# Patient Record
Sex: Female | Born: 1955 | Race: White | Hispanic: No | Marital: Married | Smoking: Former smoker
Health system: Southern US, Community
[De-identification: ages and names within clinical notes are randomized; demographics above are authoritative.]

## PROBLEM LIST (undated history)

## (undated) DIAGNOSIS — M8580 Other specified disorders of bone density and structure, unspecified site: Secondary | ICD-10-CM

## (undated) DIAGNOSIS — H269 Unspecified cataract: Secondary | ICD-10-CM

## (undated) DIAGNOSIS — E785 Hyperlipidemia, unspecified: Secondary | ICD-10-CM

## (undated) DIAGNOSIS — M810 Age-related osteoporosis without current pathological fracture: Secondary | ICD-10-CM

## (undated) HISTORY — PX: PELVIC LAPAROSCOPY: SHX162

## (undated) HISTORY — PX: OTHER SURGICAL HISTORY: SHX169

## (undated) HISTORY — DX: Age-related osteoporosis without current pathological fracture: M81.0

## (undated) HISTORY — PX: OOPHORECTOMY: SHX86

## (undated) HISTORY — PX: TUBAL LIGATION: SHX77

## (undated) HISTORY — DX: Unspecified cataract: H26.9

## (undated) HISTORY — DX: Other specified disorders of bone density and structure, unspecified site: M85.80

## (undated) HISTORY — PX: BREAST SURGERY: SHX581

## (undated) HISTORY — DX: Hyperlipidemia, unspecified: E78.5

## (undated) HISTORY — PX: JOINT REPLACEMENT: SHX530

---

## 1998-04-21 ENCOUNTER — Ambulatory Visit (HOSPITAL_BASED_OUTPATIENT_CLINIC_OR_DEPARTMENT_OTHER): Admission: RE | Admit: 1998-04-21 | Discharge: 1998-04-21 | Payer: Self-pay | Admitting: Surgery

## 1998-04-26 HISTORY — PX: BREAST EXCISIONAL BIOPSY: SUR124

## 1999-03-15 ENCOUNTER — Other Ambulatory Visit: Admission: RE | Admit: 1999-03-15 | Discharge: 1999-03-15 | Payer: Self-pay | Admitting: Obstetrics and Gynecology

## 2002-09-08 ENCOUNTER — Encounter: Admission: RE | Admit: 2002-09-08 | Discharge: 2002-09-08 | Payer: Self-pay | Admitting: Obstetrics and Gynecology

## 2002-09-08 ENCOUNTER — Encounter: Payer: Self-pay | Admitting: Obstetrics and Gynecology

## 2002-09-10 ENCOUNTER — Other Ambulatory Visit: Admission: RE | Admit: 2002-09-10 | Discharge: 2002-09-10 | Payer: Self-pay | Admitting: Obstetrics and Gynecology

## 2003-09-14 ENCOUNTER — Other Ambulatory Visit: Admission: RE | Admit: 2003-09-14 | Discharge: 2003-09-14 | Payer: Self-pay | Admitting: Obstetrics and Gynecology

## 2004-08-04 ENCOUNTER — Encounter: Admission: RE | Admit: 2004-08-04 | Discharge: 2004-08-04 | Payer: Self-pay | Admitting: Obstetrics and Gynecology

## 2004-09-20 ENCOUNTER — Other Ambulatory Visit: Admission: RE | Admit: 2004-09-20 | Discharge: 2004-09-20 | Payer: Self-pay | Admitting: Obstetrics and Gynecology

## 2005-09-04 ENCOUNTER — Ambulatory Visit: Payer: Self-pay | Admitting: Internal Medicine

## 2005-09-22 ENCOUNTER — Ambulatory Visit: Payer: Self-pay | Admitting: Internal Medicine

## 2005-09-22 ENCOUNTER — Encounter (INDEPENDENT_AMBULATORY_CARE_PROVIDER_SITE_OTHER): Payer: Self-pay | Admitting: *Deleted

## 2005-10-18 ENCOUNTER — Encounter: Admission: RE | Admit: 2005-10-18 | Discharge: 2005-10-18 | Payer: Self-pay | Admitting: Obstetrics and Gynecology

## 2005-10-24 ENCOUNTER — Other Ambulatory Visit: Admission: RE | Admit: 2005-10-24 | Discharge: 2005-10-24 | Payer: Self-pay | Admitting: Obstetrics and Gynecology

## 2006-10-30 ENCOUNTER — Encounter: Admission: RE | Admit: 2006-10-30 | Discharge: 2006-10-30 | Payer: Self-pay | Admitting: Obstetrics and Gynecology

## 2006-11-01 ENCOUNTER — Other Ambulatory Visit: Admission: RE | Admit: 2006-11-01 | Discharge: 2006-11-01 | Payer: Self-pay | Admitting: Obstetrics and Gynecology

## 2007-11-04 ENCOUNTER — Encounter: Admission: RE | Admit: 2007-11-04 | Discharge: 2007-11-04 | Payer: Self-pay | Admitting: Obstetrics and Gynecology

## 2007-11-07 ENCOUNTER — Other Ambulatory Visit: Admission: RE | Admit: 2007-11-07 | Discharge: 2007-11-07 | Payer: Self-pay | Admitting: Obstetrics and Gynecology

## 2008-02-12 ENCOUNTER — Ambulatory Visit (HOSPITAL_BASED_OUTPATIENT_CLINIC_OR_DEPARTMENT_OTHER): Admission: RE | Admit: 2008-02-12 | Discharge: 2008-02-12 | Payer: Self-pay | Admitting: Obstetrics and Gynecology

## 2008-02-12 ENCOUNTER — Encounter: Payer: Self-pay | Admitting: Obstetrics and Gynecology

## 2008-11-04 ENCOUNTER — Encounter: Admission: RE | Admit: 2008-11-04 | Discharge: 2008-11-04 | Payer: Self-pay | Admitting: Obstetrics and Gynecology

## 2008-11-09 ENCOUNTER — Other Ambulatory Visit: Admission: RE | Admit: 2008-11-09 | Discharge: 2008-11-09 | Payer: Self-pay | Admitting: Obstetrics and Gynecology

## 2008-11-09 ENCOUNTER — Ambulatory Visit: Payer: Self-pay | Admitting: Obstetrics and Gynecology

## 2009-02-09 ENCOUNTER — Ambulatory Visit: Payer: Self-pay | Admitting: Obstetrics and Gynecology

## 2009-08-18 ENCOUNTER — Ambulatory Visit: Payer: Self-pay | Admitting: Gynecology

## 2009-08-19 ENCOUNTER — Ambulatory Visit: Payer: Self-pay | Admitting: Gynecology

## 2009-08-25 ENCOUNTER — Encounter: Payer: Self-pay | Admitting: Obstetrics and Gynecology

## 2009-08-25 ENCOUNTER — Ambulatory Visit: Payer: Self-pay | Admitting: Obstetrics and Gynecology

## 2009-09-01 ENCOUNTER — Ambulatory Visit: Payer: Self-pay | Admitting: Obstetrics and Gynecology

## 2009-11-05 ENCOUNTER — Encounter: Admission: RE | Admit: 2009-11-05 | Discharge: 2009-11-05 | Payer: Self-pay | Admitting: Obstetrics and Gynecology

## 2009-11-10 ENCOUNTER — Ambulatory Visit: Payer: Self-pay | Admitting: Obstetrics and Gynecology

## 2009-11-10 ENCOUNTER — Other Ambulatory Visit: Admission: RE | Admit: 2009-11-10 | Discharge: 2009-11-10 | Payer: Self-pay | Admitting: Obstetrics and Gynecology

## 2009-11-20 HISTORY — PX: COLONOSCOPY: SHX174

## 2010-01-04 ENCOUNTER — Ambulatory Visit: Payer: Self-pay | Admitting: Obstetrics and Gynecology

## 2010-08-01 ENCOUNTER — Encounter (INDEPENDENT_AMBULATORY_CARE_PROVIDER_SITE_OTHER): Payer: Self-pay | Admitting: *Deleted

## 2010-09-01 ENCOUNTER — Encounter (INDEPENDENT_AMBULATORY_CARE_PROVIDER_SITE_OTHER): Payer: Self-pay

## 2010-09-05 ENCOUNTER — Ambulatory Visit: Payer: Self-pay | Admitting: Internal Medicine

## 2010-09-27 ENCOUNTER — Ambulatory Visit: Payer: Self-pay | Admitting: Internal Medicine

## 2010-11-29 ENCOUNTER — Encounter
Admission: RE | Admit: 2010-11-29 | Discharge: 2010-11-29 | Payer: Self-pay | Source: Home / Self Care | Attending: Obstetrics and Gynecology | Admitting: Obstetrics and Gynecology

## 2010-12-01 ENCOUNTER — Other Ambulatory Visit
Admission: RE | Admit: 2010-12-01 | Discharge: 2010-12-01 | Payer: Self-pay | Source: Home / Self Care | Admitting: Obstetrics and Gynecology

## 2010-12-01 ENCOUNTER — Ambulatory Visit
Admission: RE | Admit: 2010-12-01 | Discharge: 2010-12-01 | Payer: Self-pay | Source: Home / Self Care | Attending: Obstetrics and Gynecology | Admitting: Obstetrics and Gynecology

## 2010-12-20 NOTE — Procedures (Signed)
Summary: Colonoscopy  Patient: [name] Note: All result statuses are Final unless otherwise noted.  Tests: (1) Colonoscopy (COL)   COL Colonoscopy           DONE     [facility]     [address]     [place], [place]  [address]           COLONOSCOPY PROCEDURE REPORT           PATIENT:  Carolyn Moss, Carolyn Moss  MR#:  [id number]     BIRTHDATE:  [birth year], 54 yrs. old  GENDER:  female     ENDOSCOPIST:  Hedwig Morton. Juanda Chance, MD     REF. BY:  Edyth Gunnels, M.D.     PROCEDURE DATE:  09/27/2010     PROCEDURE:  Colonoscopy 19147     ASA CLASS:  Class I     INDICATIONS:  history of pre-cancerous (adenomatous) colon polyps     aden. polyp in 2006     MEDICATIONS:   Versed 10 mg, Fentanyl 100 mcg           DESCRIPTION OF PROCEDURE:   After the risks benefits and     alternatives of the procedure were thoroughly explained, informed     consent was obtained.  Digital rectal exam was performed and     revealed no rectal masses.   The LB PCF-H180AL [id number] endoscope     was introduced through the anus and advanced to the cecum, which     was identified by both the appendix and ileocecal valve, without     limitations.  The quality of the prep was good, using MiraLax.     The instrument was then slowly withdrawn as the colon was fully     examined.     <<PROCEDUREIMAGES>>           FINDINGS:  Mild diverticulosis was found in the sigmoid colon (see     image1 and image5).  This was otherwise a normal examination of     the colon (see image2, image3, image4, and image6).   Retroflexed     views in the rectum revealed no abnormalities.    The scope was     then withdrawn from the patient and the procedure completed.           COMPLICATIONS:  None     ENDOSCOPIC IMPRESSION:     1) Mild diverticulosis in the sigmoid colon     2) Otherwise normal examination     RECOMMENDATIONS:     1) high fiber diet     REPEAT EXAM:  In 10 year(s) for.           ______________________________  Hedwig Morton. Juanda Chance, MD           CC:           n.     eSIGNED:   Hedwig Morton. Brodie at 09/27/2010 08:55 AM           [place], [place], [phone]  Note: An exclamation mark (!) indicates a result that was not dispersed into the flowsheet. Document Creation Date: 09/27/2010 8:56 AM _______________________________________________________________________  (1) Order result status: Final Collection or observation date-time: 09/27/2010 08:50 Requested date-time:  Receipt date-time:  Reported date-time:  Referring Physician:   Ordering Physician: Lina Sar [id number]) Specimen Source:  Source: Launa Grill Order Number: [id number] Lab site:   Appended Document: Colonoscopy    Clinical Lists Changes  Observations: Added new observation of  COLONNXTDUE: 09/2020 (09/27/2010 13:12)

## 2010-12-20 NOTE — Letter (Signed)
Summary: [facility] Instructions  [facility]  [address] [place], [place] [address]   Phone: [phone]  Fax: [phone]       Carolyn Moss    [birth year]    MRN: [id number]       Procedure Day Dorna Bloom:  Jake Shark  09/27/10     Arrival Time:  7:30AM     Procedure Time:  8:30AM     Location of Procedure:                    _ X_  [facility] (4th Floor)   PREPARATION FOR COLONOSCOPY WITH MIRALAX  Starting 5 days prior to your procedure 09/22/10 do not eat nuts, seeds, popcorn, corn, beans, peas,  salads, or any raw vegetables.  Do not take any fiber supplements (e.g. Metamucil, Citrucel, and Benefiber). ____________________________________________________________________________________________________   THE DAY BEFORE YOUR PROCEDURE         DATE: 09/26/10  DAY: MONDAY  1   Drink clear liquids the entire day-NO SOLID FOOD  2   Do not drink anything colored red or purple.  Avoid juices with pulp.  No orange juice.  3   Drink at least 64 oz. (8 glasses) of fluid/clear liquids during the day to prevent dehydration and help the prep work efficiently.  CLEAR LIQUIDS INCLUDE: Water Jello Ice Popsicles Tea (sugar ok, no milk/cream) Powdered fruit flavored drinks Coffee (sugar ok, no milk/cream) Gatorade Juice: apple, white grape, white cranberry  Lemonade Clear bullion, consomm, broth Carbonated beverages (any kind) Strained chicken noodle soup Hard Candy  4   Mix the entire bottle of Miralax with 64 oz. of Gatorade/Powerade in the morning and put in the refrigerator to chill.  5   At 3:00 pm take 2 Dulcolax/Bisacodyl tablets.  6   At 4:30 pm take one Reglan/Metoclopramide tablet.  7  Starting at 5:00 pm drink one 8 oz glass of the Miralax mixture every 15-20 minutes until you have finished drinking the entire 64 oz.  You should finish drinking prep around 7:30 or 8:00 pm.  8   If you are nauseated, you may take the 2nd Reglan/Metoclopramide  tablet at 6:30 pm.        9    At 8:00 pm take 2 more DULCOLAX/Bisacodyl tablets.     THE DAY OF YOUR PROCEDURE      DATE:  09/27/10  DAY: Jake Shark  You may drink clear liquids until 6:30AM  (2 HOURS BEFORE PROCEDURE).   MEDICATION INSTRUCTIONS   none      OTHER INSTRUCTIONS  You will need a responsible adult at least 57 years of age to accompany you and drive you home.   This person must remain in the waiting room during your procedure.  Wear loose fitting clothing that is easily removed.  Leave jewelry and other valuables at home.  However, you may wish to bring a book to read or an iPod/MP3 player to listen to music as you wait for your procedure to start.  Remove all body piercing jewelry and leave at home.  Total time from sign-in until discharge is approximately 2-3 hours.  You should go home directly after your procedure and rest.  You can resume normal activities the day after your procedure.  The day of your procedure you should not:   Drive   Make legal decisions   Operate machinery   Drink alcohol   Return to work  You will receive specific instructions about eating,  activities and medications before you leave.   The above instructions have been reviewed and explained to me by  Carolyn Church RN II  September 05, 2010 11:14 AM      I fully understand and can verbalize these instructions _____________________________ Date _______

## 2010-12-20 NOTE — Letter (Signed)
Summary: Pre Visit Letter Revised  [facility]  [address] [place], [place] [address]   Phone: [phone]  Fax: [phone]        08/01/2010 MRN: [id number] [facility] [address] [place], [place]  [address]             Procedure Date:  09/21/2010   Welcome to the Gastroenterology Division at [facility].    You are scheduled to see a nurse for your pre-procedure visit on 09/05/2010 at 11:00AM on the 3rd floor at [facility], [address]  We ask that you try to arrive at our office 15 minutes prior to your appointment time to allow for check-in.  Please take a minute to review the attached form.  If you answer "Yes" to one or more of the questions on the first page, we ask that you call the person listed at your earliest opportunity.  If you answer "No" to all of the questions, please complete the rest of the form and bring it to your appointment.    Your nurse visit will consist of discussing your medical and surgical history, your immediate family medical history, and your medications.   If you are unable to list all of your medications on the form, please bring the medication bottles to your appointment and we will list them.  We will need to be aware of both prescribed and over the counter drugs.  We will need to know exact dosage information as well.    Please be prepared to read and sign documents such as consent forms, a financial agreement, and acknowledgement forms.  If necessary, and with your consent, a friend or relative is welcome to sit-in on the nurse visit with you.  Please bring your insurance card so that we may make a copy of it.  If your insurance requires a referral to see a specialist, please bring your referral form from your primary care physician.  No co-pay is required for this nurse visit.     If you cannot keep your appointment, please call [phone] to cancel or reschedule prior to your appointment date.  This  allows [place] the opportunity to schedule an appointment for another patient in need of care.    Thank you for choosing [facility] for your medical needs.  We appreciate the opportunity to care for you.  Please visit [place] at our website  to learn more about our practice.  Sincerely, The Gastroenterology Division

## 2010-12-20 NOTE — Miscellaneous (Signed)
Summary: [facility] PV prep  Clinical Lists Changes  Medications: Added new medication of MIRALAX   POWD (POLYETHYLENE GLYCOL 3350) As per prep  instructions. - Signed Added new medication of DULCOLAX 5 MG  TBEC (BISACODYL) Day before procedure take 2 at 3pm and 2 at 8pm. - Signed Added new medication of METOCLOPRAMIDE HCL 10 MG  TABS (METOCLOPRAMIDE HCL) As per prep instructions. - Signed Rx of MIRALAX   POWD (POLYETHYLENE GLYCOL 3350) As per prep  instructions.;  #255gm x 0;  Signed;  Entered by: Doristine Church RN II;  Authorized by: Hart Carwin MD;  Method used: Electronically to [facility]. #[id number]*, [address], [place], [place], [place]  [address], Ph: [phone], Fax: [phone] Rx of DULCOLAX 5 MG  TBEC (BISACODYL) Day before procedure take 2 at 3pm and 2 at 8pm.;  #4 x 0;  Signed;  Entered by: Doristine Church RN II;  Authorized by: Hart Carwin MD;  Method used: Electronically to [facility]. #[id number]*, [address] [place], [place], [place]  [address], Ph: [phone], Fax: [phone] Rx of METOCLOPRAMIDE HCL 10 MG  TABS (METOCLOPRAMIDE HCL) As per prep instructions.;  #2 x 0;  Signed;  Entered by: Doristine Church RN II;  Authorized by: Hart Carwin MD;  Method used: Electronically to [facility]. #[id number]*, [address] [place], [place], [place]  [address], Ph: [phone], Fax: [phone] Observations: Added new observation of ALLERGY REV: Done (09/05/2010 10:46) Added new observation of NKA: T (09/05/2010 10:46)    Prescriptions: METOCLOPRAMIDE HCL 10 MG  TABS (METOCLOPRAMIDE HCL) As per prep instructions.  #2 x 0   Entered by:   Doristine Church RN II   Authorized by:   Hart Carwin MD   Signed by:   Doristine Church RN II on 09/05/2010   Method used:   Electronically to        [facility]. [phone]* (retail)       [address]       [place], [place]  [address]  Ph: [phone]       Fax: [phone]   RxID:   [id number] DULCOLAX 5 MG  TBEC (BISACODYL) Day before procedure take 2 at 3pm and 2 at 8pm.  #4 x 0   Entered by:   Doristine Church RN II   Authorized by:   Hart Carwin MD   Signed by:   Doristine Church RN II on 09/05/2010   Method used:   Electronically to        [facility]. [phone]* (retail)       [address]       [place], [place]  [address]       Ph: [phone]       Fax: [phone]   RxID:   [id number] MIRALAX   POWD (POLYETHYLENE GLYCOL 3350) As per prep  instructions.  #255gm x 0   Entered by:   Doristine Church RN II   Authorized by:   Hart Carwin MD   Signed by:   Doristine Church RN II on 09/05/2010   Method used:   Electronically to        [facility]. [phone]* (retail)       [address]       [facility]  [place], [place]  [address]       Ph: [phone]       Fax: [phone]   RxID:   [id number]

## 2011-04-04 NOTE — Op Note (Signed)
NAME:  Carolyn Moss, Carolyn Moss                ACCOUNT NO.:  [id number]   MEDICAL RECORD NO.:  [id number]          PATIENT TYPE:  AMB   LOCATION:  [facility]                         FACILITY:  [facility]   PHYSICIAN:  Daniel L. Gottsegen, M.D.DATE OF BIRTH:  07-16-1956   DATE OF PROCEDURE:  02/12/2008  DATE OF DISCHARGE:                               OPERATIVE REPORT   PREOPERATIVE DIAGNOSIS:  Right adnexal mass, probable endometrioma.   POSTOPERATIVE DIAGNOSIS:  Right endometrioma.   OPERATION:  Diagnostic laparoscopy with right salpingo-oophorectomy.   SURGEON:  Daniel L. Eda Paschal, M.D.   FIRST ASSISTANT:  Timothy P. Fontaine, M.D.   INDICATIONS:  The patient is a 55 year old gravida 3, para 3, AB zero  who had presented to the office for her annual exam.  Findings consisted  of a new adnexal mass on the right ovary.  Ultrasound revealed a 4 x 6  cm enlargement of the right ovary.  It had the appearance of an  endometrioma with a ground glass appearance.  CA-125, however, was  elevated at 85.  It was elected initially just to watch it with hopes  that it was a hemorrhagic cyst with an elevated CA-125 and it would  disappear.  However, serial ultrasound showed that it had not changed in  size at all and possibly had even grown a little bit.  As a result of  this she was taken to the operating room for definitive surgery.   FINDINGS:  At the time of surgery the patient's uterus was enlarged by 2  several small fibroids.  Left ovary and tube were normal.  Pelvic  peritoneum and vesicouterine fold of peritoneum were free of disease.  Right ovary was significantly enlarged to about 6 cm.  There was a  surface implant of endometriosis on it.  Once the ovary had been removed  there was chocolate material draining from the ovary consistent with an  endometrioma.   PROCEDURE IN DETAIL:  After adequate general endotracheal anesthesia the  patient was placed in the dorsal lithotomy position, prepped and  draped  in the usual sterile manner.  A Hulka catheter was inserted in to the  patient's uterus.  A Foley catheter was inserted in the patient's  bladder.  A subumbilical midline incision was made and through that the  laparoscope was introduced directly using an OptiView without any trauma  to the peritoneal cavity or its structures.  Two 5 mm ports were placed  in the right and left lower quadrant.  The pelvis was visualized and was  as noted above.  Peritoneal washings were obtained.  The right adnexa  was then elevated and the ureter was identified.  The IP ligament was  isolated far away from the ureter and using the LigaSure  laparoscopically the IP ligament was bipolared and cut.  The rest of the  structures involving the adnexa and broad ligament were cauterized and  then cut and then the attachment of the ovary and tube to the uterus was  cauterized and cut.  There was a little bit of bleeding at the area of  the  IP ligament and this was controlled again with cautery.  The ureter  continued to be far from the area of concern.  Urine output continued to  be copious and clear.  A 5 mm scope was then introduced and an Endopouch  was placed subumbilically.  The specimen was removed.  When it was  mostly through the fascia it could not be completely removed without  making the incision extended significantly so at this point a needle was  used with a syringe and chocolate material was drained from the cyst to  collapse it without any of it entering the patient.  This was done  without difficulty and then the ovary and tube were removed and sent to  pathology for tissue diagnosis.  The 10 mm scope was reintroduced.  Copious irrigation was done.  There was absolutely no bleeding noted.  Therefore all instrumentation was removed.  The pneumoperitoneum was  evacuated.  The subumbilical fascial incision was closed with two figure-  of-eights of zero Vicryl.  The subumbilical skin incision  was closed  with 3-0 Monocryl.  The two lower incisions were closed with Dermabond.  The patient tolerated the procedure well and left the operating room in  satisfactory condition draining clear urine from her Foley catheter  which was then removed.      Daniel L. Eda Paschal, M.D.  Electronically Signed     DLG/MEDQ  D:  02/12/2008  T:  02/12/2008  Job:  [id number]

## 2011-11-27 ENCOUNTER — Other Ambulatory Visit: Payer: Self-pay | Admitting: Obstetrics and Gynecology

## 2011-11-27 DIAGNOSIS — Z1231 Encounter for screening mammogram for malignant neoplasm of breast: Secondary | ICD-10-CM

## 2011-12-07 ENCOUNTER — Encounter: Payer: Self-pay | Admitting: Gynecology

## 2011-12-07 DIAGNOSIS — N809 Endometriosis, unspecified: Secondary | ICD-10-CM | POA: Insufficient documentation

## 2011-12-14 ENCOUNTER — Ambulatory Visit
Admission: RE | Admit: 2011-12-14 | Discharge: 2011-12-14 | Disposition: A | Discharge status: Home / Self Care | Payer: BC Managed Care – PPO | Source: Ambulatory Visit | Attending: Obstetrics and Gynecology | Admitting: Obstetrics and Gynecology

## 2011-12-14 ENCOUNTER — Ambulatory Visit (INDEPENDENT_AMBULATORY_CARE_PROVIDER_SITE_OTHER): Payer: BC Managed Care – PPO | Admitting: Obstetrics and Gynecology

## 2011-12-14 ENCOUNTER — Other Ambulatory Visit: Payer: BC Managed Care – PPO

## 2011-12-14 ENCOUNTER — Encounter: Payer: Self-pay | Admitting: Obstetrics and Gynecology

## 2011-12-14 ENCOUNTER — Other Ambulatory Visit (HOSPITAL_COMMUNITY)
Admission: RE | Admit: 2011-12-14 | Discharge: 2011-12-14 | Disposition: A | Discharge status: Home / Self Care | Payer: BC Managed Care – PPO | Source: Ambulatory Visit | Attending: Obstetrics and Gynecology | Admitting: Obstetrics and Gynecology

## 2011-12-14 ENCOUNTER — Other Ambulatory Visit: Payer: Self-pay | Admitting: *Deleted

## 2011-12-14 VITALS — BP 124/76 | Ht 62.0 in | Wt 159.0 lb

## 2011-12-14 DIAGNOSIS — Z01419 Encounter for gynecological examination (general) (routine) without abnormal findings: Secondary | ICD-10-CM

## 2011-12-14 DIAGNOSIS — Z13220 Encounter for screening for lipoid disorders: Secondary | ICD-10-CM

## 2011-12-14 DIAGNOSIS — E559 Vitamin D deficiency, unspecified: Secondary | ICD-10-CM

## 2011-12-14 DIAGNOSIS — Z1231 Encounter for screening mammogram for malignant neoplasm of breast: Secondary | ICD-10-CM

## 2011-12-14 LAB — LIPID PANEL
Cholesterol: 241 mg/dL — ABNORMAL HIGH (ref 0–200)
HDL: 61 mg/dL (ref >39)
Triglycerides: 105 mg/dL (ref <150)

## 2011-12-14 LAB — URINALYSIS W MICROSCOPIC + REFLEX CULTURE
Glucose, UA: NEGATIVE mg/dL
Hgb urine dipstick: NEGATIVE
Leukocytes, UA: NEGATIVE
Nitrite: NEGATIVE
pH: 7 (ref 5.0–8.0)

## 2011-12-14 LAB — CBC WITH DIFFERENTIAL/PLATELET
Basophils Absolute: 0 10*3/uL (ref 0.0–0.1)
Basophils Relative: 1 % (ref 0–1)
HCT: 41.3 % (ref 36.0–46.0)
Hemoglobin: 13.7 g/dL (ref 12.0–15.0)
Lymphocytes Relative: 27 % (ref 12–46)
MCHC: 33.2 g/dL (ref 30.0–36.0)
Monocytes Absolute: 0.6 10*3/uL (ref 0.1–1.0)
Neutro Abs: 3.5 10*3/uL (ref 1.7–7.7)
Neutrophils Relative %: 56 % (ref 43–77)
RDW: 13.4 % (ref 11.5–15.5)
WBC: 6.2 10*3/uL (ref 4.0–10.5)

## 2011-12-14 NOTE — Patient Instructions (Signed)
Scheduled bone density in February or March.

## 2011-12-14 NOTE — Progress Notes (Signed)
Patient came to see me today for her annual GYN exam. We also did lab work on her this morning. She had a mammogram this afternoon. Her last bone density was in 2011 and she had osteopenia without  an elevated FRAX risk. She takes calcium and vitamin D. She has had no fractures. She's had no more postmenopausal bleeding. She is having no pelvic pain. She does well without HRT.  Physical examination: Kennon Portela present. HEENT within normal limits. Neck: Thyroid not large. No masses. Supraclavicular nodes: not enlarged. Breasts: Examined in both sitting midline position. No skin changes and no masses. Abdomen: Soft no guarding rebound or masses or hernia. Pelvic: External: Within normal limits. BUS: Within normal limits. Vaginal:within normal limits. Good estrogen effect. No evidence of cystocele rectocele or enterocele. Cervix: clean. Uterus: Normal size and shape. Adnexa: No masses. Rectovaginal exam: Confirmatory and negative. Extremities: Within normal limits.  Assessment: Low bone mass  Plan: Continue yearly mammograms. Continue periodic bone densities.

## 2011-12-15 ENCOUNTER — Other Ambulatory Visit: Payer: Self-pay | Admitting: *Deleted

## 2011-12-15 DIAGNOSIS — E7800 Pure hypercholesterolemia, unspecified: Secondary | ICD-10-CM

## 2011-12-15 LAB — VITAMIN D 25 HYDROXY (VIT D DEFICIENCY, FRACTURES): Vit D, 25-Hydroxy: 33 ng/mL (ref 30–89)

## 2011-12-22 DIAGNOSIS — M8580 Other specified disorders of bone density and structure, unspecified site: Secondary | ICD-10-CM

## 2011-12-22 HISTORY — DX: Other specified disorders of bone density and structure, unspecified site: M85.80

## 2012-01-09 ENCOUNTER — Other Ambulatory Visit: Payer: Self-pay | Admitting: Obstetrics and Gynecology

## 2012-01-09 ENCOUNTER — Ambulatory Visit (INDEPENDENT_AMBULATORY_CARE_PROVIDER_SITE_OTHER): Payer: BC Managed Care – PPO

## 2012-01-09 DIAGNOSIS — M8580 Other specified disorders of bone density and structure, unspecified site: Secondary | ICD-10-CM

## 2012-01-09 DIAGNOSIS — M899 Disorder of bone, unspecified: Secondary | ICD-10-CM

## 2012-01-09 DIAGNOSIS — M949 Disorder of cartilage, unspecified: Secondary | ICD-10-CM

## 2012-12-06 ENCOUNTER — Other Ambulatory Visit: Payer: Self-pay | Admitting: Gynecology

## 2012-12-06 DIAGNOSIS — Z1231 Encounter for screening mammogram for malignant neoplasm of breast: Secondary | ICD-10-CM

## 2012-12-17 ENCOUNTER — Encounter: Payer: BC Managed Care – PPO | Admitting: Gynecology

## 2012-12-31 ENCOUNTER — Ambulatory Visit
Admission: RE | Admit: 2012-12-31 | Discharge: 2012-12-31 | Disposition: A | Discharge status: Home / Self Care | Payer: BC Managed Care – PPO | Source: Ambulatory Visit | Attending: Gynecology | Admitting: Gynecology

## 2012-12-31 DIAGNOSIS — Z1231 Encounter for screening mammogram for malignant neoplasm of breast: Secondary | ICD-10-CM

## 2013-01-01 ENCOUNTER — Encounter: Payer: Self-pay | Admitting: Gynecology

## 2013-01-01 ENCOUNTER — Ambulatory Visit (INDEPENDENT_AMBULATORY_CARE_PROVIDER_SITE_OTHER): Payer: BC Managed Care – PPO | Admitting: Gynecology

## 2013-01-01 VITALS — BP 122/78 | Ht 63.0 in | Wt 158.0 lb

## 2013-01-01 DIAGNOSIS — Z01419 Encounter for gynecological examination (general) (routine) without abnormal findings: Secondary | ICD-10-CM

## 2013-01-01 DIAGNOSIS — Z13220 Encounter for screening for lipoid disorders: Secondary | ICD-10-CM

## 2013-01-01 DIAGNOSIS — M899 Disorder of bone, unspecified: Secondary | ICD-10-CM

## 2013-01-01 DIAGNOSIS — M8580 Other specified disorders of bone density and structure, unspecified site: Secondary | ICD-10-CM

## 2013-01-01 LAB — CBC WITH DIFFERENTIAL/PLATELET
Basophils Absolute: 0 10*3/uL (ref 0.0–0.1)
Basophils Relative: 1 % (ref 0–1)
Eosinophils Absolute: 0.4 10*3/uL (ref 0.0–0.7)
Eosinophils Relative: 5 % (ref 0–5)
HCT: 41.5 % (ref 36.0–46.0)
Hemoglobin: 14.3 g/dL (ref 12.0–15.0)
MCH: 30.6 pg (ref 26.0–34.0)
MCHC: 34.5 g/dL (ref 30.0–36.0)
MCV: 88.7 fL (ref 78.0–100.0)
Monocytes Absolute: 0.6 10*3/uL (ref 0.1–1.0)
Monocytes Relative: 8 % (ref 3–12)
RDW: 14.1 % (ref 11.5–15.5)

## 2013-01-01 LAB — COMPREHENSIVE METABOLIC PANEL
Albumin: 4.8 g/dL (ref 3.5–5.2)
Alkaline Phosphatase: 50 U/L (ref 39–117)
BUN: 10 mg/dL (ref 6–23)
Creat: 0.88 mg/dL (ref 0.50–1.10)
Glucose, Bld: 86 mg/dL (ref 70–99)
Potassium: 3.8 mEq/L (ref 3.5–5.3)
Total Bilirubin: 0.5 mg/dL (ref 0.3–1.2)

## 2013-01-01 LAB — LIPID PANEL
HDL: 57 mg/dL (ref >39)
LDL Cholesterol: 162 mg/dL — ABNORMAL HIGH (ref 0–99)
Total CHOL/HDL Ratio: 4.5 Ratio
Triglycerides: 201 mg/dL — ABNORMAL HIGH (ref <150)

## 2013-01-01 NOTE — Progress Notes (Signed)
Carolyn Moss [birth year] [id number]        56 y.o.  G3P3003 for annual exam.  Former patient of Dr. Eda Paschal is doing well.  Past medical history,surgical history, medications, allergies, family history and social history were all reviewed and documented in the EPIC chart. ROS:  Was performed and pertinent positives and negatives are included in the history.  Exam: [name] assistant Filed Vitals:   01/01/13 1117  BP: 122/78  Height: 5\' 3"  (1.6 m)  Weight: 158 lb (71.668 kg)   General appearance  Normal Skin grossly normal Head/Neck normal with no cervical or supraclavicular adenopathy thyroid normal Lungs  clear Cardiac RR, without RMG Abdominal  soft, nontender, without masses, organomegaly or hernia Breasts  examined lying and sitting without masses, retractions, discharge or axillary adenopathy. Pelvic  Ext/BUS/vagina  normal with mild atrophic changes  Cervix  normal   Uterus  anteverted, normal size, shape and contour, midline and mobile nontender   Adnexa  Without masses or tenderness    Anus and perineum  normal   Rectovaginal  normal sphincter tone without palpated masses or tenderness.    Assessment/Plan:  57 y.o. G73P3003 female for annual exam.   1. Postmenopausal. Doing well without significant symptoms. We'll continue to monitor. Patient knows to report any bleeding at all. 2. Osteopenia. DEXA 12/2011 with T score -1.1 FRAX 13%/0.04%. Plan repeat in another year or 2. Increase calcium vitamin D reviewed. 3. Very strong history of breast cancer.   Mother developed at age 2. Patient's sister developed at age 74. Maternal grandmother at age 16. I asked whether genetic testing was done and she does not think so. I reviewed my strong recommendation that she consider genetic testing. Her sister is alive and well and I recommended she be tested. If she refuses then the patient can be tested. I also offered referral for genetic counseling to better define her risk. I reviewed what  she would do with the results to include if gene-positive considering prophylactic mastectomy and certainly salpingo-oophorectomy. Increased risk of ovarian cancer with positive gene discussed. Also discussed altering screening protocols to add MRI if gene positive.  Patient's unwilling to proceed at this point, will think about her options and follow up if she desires to pursue counseling or direct testing. She is going to talk to her sister to see if she was tested and then go from there.  Just had mammogram yesterday. Continue with annual mammography. SBE monthly reviewed. 4. Pap smear 2013. No Pap smear done today. No history of significant abnormal Pap smears.  I reviewed current screening guidelines and will plan repeat Pap smear at 3 year interval. 5. Colonoscopy 2011. We'll follow up with their recommended interval. 6. Health maintenance. Has not had blood work drawn for some time.  Baseline CBC comprehensive metabolic panel TSH urinalysis vitamin D lipid profile ordered.    Dara Lords MD, 12:58 PM 01/01/2013

## 2013-01-01 NOTE — Patient Instructions (Addendum)
Consider testing or have your sister tested for the breast cancer gene. Follow up for lab results through My chart Follow up in one year for annual exam.

## 2013-01-03 LAB — URINALYSIS W MICROSCOPIC + REFLEX CULTURE
Bilirubin Urine: NEGATIVE
Hgb urine dipstick: NEGATIVE
Ketones, ur: NEGATIVE mg/dL
Specific Gravity, Urine: 1.01 (ref 1.005–1.030)
Urobilinogen, UA: 0.2 mg/dL (ref 0.0–1.0)

## 2013-01-07 ENCOUNTER — Other Ambulatory Visit: Payer: Self-pay | Admitting: Gynecology

## 2013-01-07 DIAGNOSIS — E785 Hyperlipidemia, unspecified: Secondary | ICD-10-CM

## 2014-01-12 ENCOUNTER — Other Ambulatory Visit: Payer: Self-pay

## 2014-01-12 DIAGNOSIS — Z1231 Encounter for screening mammogram for malignant neoplasm of breast: Secondary | ICD-10-CM

## 2014-01-29 ENCOUNTER — Ambulatory Visit
Admission: RE | Admit: 2014-01-29 | Discharge: 2014-01-29 | Disposition: A | Discharge status: Home / Self Care | Payer: BC Managed Care – PPO | Source: Ambulatory Visit

## 2014-01-29 DIAGNOSIS — Z1231 Encounter for screening mammogram for malignant neoplasm of breast: Secondary | ICD-10-CM

## 2014-02-17 ENCOUNTER — Ambulatory Visit (INDEPENDENT_AMBULATORY_CARE_PROVIDER_SITE_OTHER): Payer: BC Managed Care – PPO | Admitting: Gynecology

## 2014-02-17 ENCOUNTER — Encounter: Payer: Self-pay | Admitting: Gynecology

## 2014-02-17 VITALS — BP 122/78 | Ht 61.0 in | Wt 161.0 lb

## 2014-02-17 DIAGNOSIS — M8580 Other specified disorders of bone density and structure, unspecified site: Secondary | ICD-10-CM | POA: Insufficient documentation

## 2014-02-17 DIAGNOSIS — M949 Disorder of cartilage, unspecified: Secondary | ICD-10-CM

## 2014-02-17 DIAGNOSIS — N951 Menopausal and female climacteric states: Secondary | ICD-10-CM

## 2014-02-17 DIAGNOSIS — Z8639 Personal history of other endocrine, nutritional and metabolic disease: Secondary | ICD-10-CM | POA: Insufficient documentation

## 2014-02-17 DIAGNOSIS — Z01419 Encounter for gynecological examination (general) (routine) without abnormal findings: Secondary | ICD-10-CM

## 2014-02-17 DIAGNOSIS — Z780 Asymptomatic menopausal state: Secondary | ICD-10-CM | POA: Insufficient documentation

## 2014-02-17 DIAGNOSIS — Z1159 Encounter for screening for other viral diseases: Secondary | ICD-10-CM

## 2014-02-17 DIAGNOSIS — M899 Disorder of bone, unspecified: Secondary | ICD-10-CM

## 2014-02-17 NOTE — Progress Notes (Signed)
Carolyn Moss [birth year] [id number]   History:    58 y.o.  for annual gyn exam with no complaints today. Review of patient's record as indicated that patient has a strong family history of breast cancer as follows:  Mother developed at age 80. Patient's sister developed at age 3. Maternal grandmother at age 47. Patient has previously been offered genetic testing but patient has been adamant. She is not certain if her sister who had breast cancer was tested for BRCA1 and BRCA2 gene mutation. Mammogram this year normal. Patient does her monthly breast exams.  Patient had a colonoscopy in 2011 but she has informed me that she has had benign polyps in the past. Her Tdap vaccine is up to date but she has not received the shingles vaccine yet. She gets her blood drawn here she has no other PCP. Patient with no prior history of abnormal Pap smears.  Patient had laparoscopic right salpingo-oophorectomy in the past for endometriosis. Patient with no visual motor symptoms and has not been on any HRT in the past.  Bone density study done in 2013 demonstrated evidence of osteopenia with T score -1.1 FRAX 13%/0.04%.    Past medical history,surgical history, family history and social history were all reviewed and documented in the EPIC chart.  Gynecologic History No LMP recorded. Patient is postmenopausal. Contraception: post menopausal status Last Pap: 2013. Results were: normal Last mammogram: 2015. Results were: normal  Obstetric History OB History  Gravida Para Term Preterm AB SAB TAB Ectopic Multiple Living  $Remov'3 3 3       3    'ypkuLl$ # Outcome Date GA Lbr Len/2nd Weight Sex Delivery Anes PTL Lv  3 TRM           2 TRM           1 TRM                ROS: A ROS was performed and pertinent positives and negatives are included in the history.  GENERAL: No fevers or chills. HEENT: No change in vision, no earache, sore throat or sinus congestion. NECK: No pain or stiffness. CARDIOVASCULAR: No chest  pain or pressure. No palpitations. PULMONARY: No shortness of breath, cough or wheeze. GASTROINTESTINAL: No abdominal pain, nausea, vomiting or diarrhea, melena or bright red blood per rectum. GENITOURINARY: No urinary frequency, urgency, hesitancy or dysuria. MUSCULOSKELETAL: No joint or muscle pain, no back pain, no recent trauma. DERMATOLOGIC: No rash, no itching, no lesions. ENDOCRINE: No polyuria, polydipsia, no heat or cold intolerance. No recent change in weight. HEMATOLOGICAL: No anemia or easy bruising or bleeding. NEUROLOGIC: No headache, seizures, numbness, tingling or weakness. PSYCHIATRIC: No depression, no loss of interest in normal activity or change in sleep pattern.     Exam: chaperone present  BP 122/78  Ht $R'5\' 1"'su$  (1.549 m)  Wt 161 lb (73.029 kg)  BMI 30.44 kg/m2  Body mass index is 30.44 kg/(m^2).  General appearance : Well developed well nourished female. No acute distress HEENT: Neck supple, trachea midline, no carotid bruits, no thyroidmegaly Lungs: Clear to auscultation, no rhonchi or wheezes, or rib retractions  Heart: Regular rate and rhythm, no murmurs or gallops Breast:Examined in sitting and supine position were symmetrical in appearance, no palpable masses or tenderness,  no skin retraction, no nipple inversion, no nipple discharge, no skin discoloration, no axillary or supraclavicular lymphadenopathy Abdomen: no palpable masses or tenderness, no rebound or guarding Extremities: no edema or skin discoloration or tenderness  Pelvic:  Bartholin, Urethra, Skene Glands: Within normal limits             Vagina: No gross lesions or discharge  Cervix: No gross lesions or discharge  Uterus  anteverted, normal size, shape and consistency, non-tender and mobile  Adnexa  Without masses or tenderness  Anus and perineum  normal   Rectovaginal  normal sphincter tone without palpated masses or tenderness             Hemoccult cards provided     Assessment/Plan:  58 y.o.  female for annual exam will return to the office later this week in the fasting state for the following lab work: Fasting lipid profile, comprehensive metabolic panel, TSH, vitamin D level, and urinalysis.  New CDC guidelines is recommending patients be tested once in her lifetime for hepatitis C antibody who were born between [birth year] through 1967. This was discussed with the patient today and has agreed to be tested today.  Patient will also schedule for her bone density study. Pap smear not done today in accordance to the new guidelines. She was once again counseled on the importance of being tested for BRCA1 and BRCA2 gene mutation as well as consultation with the geneticist and she states that she will continue to think about it.  We discussed importance of calcium and vitamin D and regular exercise for osteoporosis prevention. She was given a prescription to have her shingles vaccine. She was reminded to submit to the office the Fecal Hemoccult cards for testing.  Note: This dictation was prepared with  Dragon/digital dictation along withSmart phrase technology. Any transcriptional errors that result from this process are unintentional.   Terrance Mass MD, 11:11 AM 02/17/2014

## 2014-02-17 NOTE — Patient Instructions (Signed)
Shingles Vaccine  What You Need to Know  WHAT IS SHINGLES?  · Shingles is a painful skin rash, often with blisters. It is also called Herpes Zoster or just Zoster.  · A shingles rash usually appears on one side of the face or body and lasts from 2 to 4 weeks. Its main symptom is pain, which can be quite severe. Other symptoms of shingles can include fever, headache, chills, and upset stomach. Very rarely, a shingles infection can lead to pneumonia, hearing problems, blindness, brain inflammation (encephalitis), or death.  · For about 1 person in 5, severe pain can continue even after the rash clears up. This is called post-herpetic neuralgia.  · Shingles is caused by the Varicella Zoster virus. This is the same virus that causes chickenpox. Only someone who has had a case of chickenpox or rarely, has gotten chickenpox vaccine, can get shingles. The virus stays in your body. It can reappear many years later to cause a case of shingles.  · You cannot catch shingles from another person with shingles. However, a person who has never had chickenpox (or chickenpox vaccine) could get chickenpox from someone with shingles. This is not very common.  · Shingles is far more common in people 50 and older than in younger people. It is also more common in people whose immune systems are weakened because of a disease such as cancer or drugs such as steroids or chemotherapy.  · At least 1 million people get shingles per year in the [place].  SHINGLES VACCINE  · A vaccine for shingles was licensed in 2006. In clinical trials, the vaccine reduced the risk of shingles by 50%. It can also reduce the pain in people who still get shingles after being vaccinated.  · A single dose of shingles vaccine is recommended for adults 60 years of age and older.  SOME PEOPLE SHOULD NOT GET SHINGLES VACCINE OR SHOULD WAIT  A person should not get shingles vaccine if he or she:  · Has ever had a life-threatening allergic reaction to gelatin, the  antibiotic neomycin, or any other component of shingles vaccine. Tell your caregiver if you have any severe allergies.  · Has a weakened immune system because of current:  · AIDS or another disease that affects the immune system.  · Treatment with drugs that affect the immune system, such as prolonged use of high-dose steroids.  · Cancer treatment, such as radiation or chemotherapy.  · Cancer affecting the bone marrow or lymphatic system, such as leukemia or lymphoma.  · Is pregnant, or might be pregnant. Women should not become pregnant until at least 4 weeks after getting shingles vaccine.  Someone with a minor illness, such as a cold, may be vaccinated. Anyone with a moderate or severe acute illness should usually wait until he or she recovers before getting the vaccine. This includes anyone with a temperature of 101.3° F (38° C) or higher.  WHAT ARE THE RISKS FROM SHINGLES VACCINE?  · A vaccine, like any medicine, could possibly cause serious problems, such as severe allergic reactions. However, the risk of a vaccine causing serious harm, or death, is extremely small.  · No serious problems have been identified with shingles vaccine.  Mild Problems  · Redness, soreness, swelling, or itching at the site of the injection (about 1 person in 3).  · Headache (about 1 person in 70).  Like all vaccines, shingles vaccine is being closely monitored for unusual or severe problems.  WHAT IF   THERE IS A MODERATE OR SEVERE REACTION?  What should I look for?  Any unusual condition, such as a severe allergic reaction or a high fever. If a severe allergic reaction occurred, it would be within a few minutes to an hour after the shot. Signs of a serious allergic reaction can include difficulty breathing, weakness, hoarseness or wheezing, a fast heartbeat, hives, dizziness, paleness, or swelling of the throat.  What should I do?  · Call your caregiver, or get the person to a caregiver right away.  · Tell the caregiver what  happened, the date and time it happened, and when the vaccination was given.  · Ask the caregiver to report the reaction by filing a Vaccine Adverse Event Reporting System (VAERS) form. Or, you can file this report through the VAERS web site at www.vaers.hhs.gov or by calling 1-800-822-7967.  VAERS does not provide medical advice.  HOW CAN I LEARN MORE?  · Ask your caregiver. He or she can give you the vaccine package insert or suggest other sources of information.  · Contact the Centers for Disease Control and Prevention (CDC):  · Call 1-800-232-4636 (1-800-CDC-INFO).  · Visit the CDC website at www.cdc.gov/vaccines  CDC Shingles Vaccine VIS (08/25/08)  Document Released: 09/03/2006 Document Revised: 01/29/2012 Document Reviewed: 02/26/2013  [employer]® Patient Information ©2014 [employer].

## 2014-02-18 ENCOUNTER — Ambulatory Visit: Payer: BC Managed Care – PPO

## 2014-02-18 DIAGNOSIS — Z01419 Encounter for gynecological examination (general) (routine) without abnormal findings: Secondary | ICD-10-CM

## 2014-02-18 DIAGNOSIS — Z8639 Personal history of other endocrine, nutritional and metabolic disease: Secondary | ICD-10-CM

## 2014-02-18 DIAGNOSIS — Z1159 Encounter for screening for other viral diseases: Secondary | ICD-10-CM

## 2014-02-18 LAB — LIPID PANEL
CHOL/HDL RATIO: 4.5 ratio
CHOLESTEROL: 246 mg/dL — AB (ref 0–200)
HDL: 55 mg/dL (ref >39)
LDL Cholesterol: 162 mg/dL — ABNORMAL HIGH (ref 0–99)
Triglycerides: 143 mg/dL (ref <150)
VLDL: 29 mg/dL (ref 0–40)

## 2014-02-18 LAB — CBC WITH DIFFERENTIAL/PLATELET
BASOS ABS: 0.1 10*3/uL (ref 0.0–0.1)
BASOS PCT: 1 % (ref 0–1)
EOS ABS: 0.5 10*3/uL (ref 0.0–0.7)
Eosinophils Relative: 9 % — ABNORMAL HIGH (ref 0–5)
HCT: 39.2 % (ref 36.0–46.0)
HEMOGLOBIN: 13.7 g/dL (ref 12.0–15.0)
Lymphocytes Relative: 27 % (ref 12–46)
Lymphs Abs: 1.5 10*3/uL (ref 0.7–4.0)
MCH: 30.9 pg (ref 26.0–34.0)
MCHC: 34.9 g/dL (ref 30.0–36.0)
MCV: 88.5 fL (ref 78.0–100.0)
MONOS PCT: 9 % (ref 3–12)
Monocytes Absolute: 0.5 10*3/uL (ref 0.1–1.0)
NEUTROS ABS: 3.1 10*3/uL (ref 1.7–7.7)
NEUTROS PCT: 54 % (ref 43–77)
PLATELETS: 256 10*3/uL (ref 150–400)
RBC: 4.43 MIL/uL (ref 3.87–5.11)
RDW: 13.6 % (ref 11.5–15.5)
WBC: 5.7 10*3/uL (ref 4.0–10.5)

## 2014-02-18 LAB — COMPREHENSIVE METABOLIC PANEL
ALBUMIN: 4.3 g/dL (ref 3.5–5.2)
ALK PHOS: 54 U/L (ref 39–117)
ALT: 16 U/L (ref 0–35)
AST: 16 U/L (ref 0–37)
BILIRUBIN TOTAL: 0.4 mg/dL (ref 0.2–1.2)
BUN: 13 mg/dL (ref 6–23)
CO2: 22 mEq/L (ref 19–32)
Calcium: 9.2 mg/dL (ref 8.4–10.5)
Chloride: 107 mEq/L (ref 96–112)
Creat: 0.81 mg/dL (ref 0.50–1.10)
Glucose, Bld: 93 mg/dL (ref 70–99)
POTASSIUM: 4.6 meq/L (ref 3.5–5.3)
SODIUM: 140 meq/L (ref 135–145)
TOTAL PROTEIN: 6.2 g/dL (ref 6.0–8.3)

## 2014-02-18 LAB — TSH: TSH: 2.188 u[IU]/mL (ref 0.350–4.500)

## 2014-02-19 ENCOUNTER — Other Ambulatory Visit: Payer: Self-pay | Admitting: Gynecology

## 2014-02-19 DIAGNOSIS — E559 Vitamin D deficiency, unspecified: Secondary | ICD-10-CM

## 2014-02-19 LAB — URINALYSIS W MICROSCOPIC + REFLEX CULTURE
BACTERIA UA: NONE SEEN
BILIRUBIN URINE: NEGATIVE
CASTS: NONE SEEN
CRYSTALS: NONE SEEN
GLUCOSE, UA: NEGATIVE mg/dL
HGB URINE DIPSTICK: NEGATIVE
KETONES UR: NEGATIVE mg/dL
NITRITE: NEGATIVE
PH: 7.5 (ref 5.0–8.0)
Protein, ur: NEGATIVE mg/dL
SPECIFIC GRAVITY, URINE: 1.02 (ref 1.005–1.030)
Squamous Epithelial / LPF: NONE SEEN
Urobilinogen, UA: 0.2 mg/dL (ref 0.0–1.0)

## 2014-02-19 LAB — HEPATITIS C ANTIBODY: HCV AB: NEGATIVE

## 2014-02-19 LAB — VITAMIN D 25 HYDROXY (VIT D DEFICIENCY, FRACTURES): VIT D 25 HYDROXY: 25 ng/mL — AB (ref 30–89)

## 2014-02-19 MED ORDER — VITAMIN D (ERGOCALCIFEROL) 1.25 MG (50000 UNIT) PO CAPS: 50000.0000 [IU] | ORAL_CAPSULE | ORAL | Status: AC

## 2014-02-22 LAB — URINE CULTURE: Colony Count: 90000

## 2014-02-24 ENCOUNTER — Other Ambulatory Visit: Payer: Self-pay | Admitting: Gynecology

## 2014-02-24 MED ORDER — CIPROFLOXACIN HCL 250 MG PO TABS: 250.0000 mg | ORAL_TABLET | Freq: Two times a day (BID) | ORAL | Status: DC

## 2014-03-05 ENCOUNTER — Other Ambulatory Visit: Payer: Self-pay | Admitting: Gynecology

## 2014-03-05 ENCOUNTER — Ambulatory Visit (INDEPENDENT_AMBULATORY_CARE_PROVIDER_SITE_OTHER): Payer: BC Managed Care – PPO

## 2014-03-05 DIAGNOSIS — Z780 Asymptomatic menopausal state: Secondary | ICD-10-CM

## 2014-03-05 DIAGNOSIS — M899 Disorder of bone, unspecified: Secondary | ICD-10-CM

## 2014-03-05 DIAGNOSIS — M8580 Other specified disorders of bone density and structure, unspecified site: Secondary | ICD-10-CM

## 2014-03-05 DIAGNOSIS — M949 Disorder of cartilage, unspecified: Secondary | ICD-10-CM

## 2014-03-05 DIAGNOSIS — N951 Menopausal and female climacteric states: Secondary | ICD-10-CM

## 2014-03-11 ENCOUNTER — Other Ambulatory Visit: Payer: BC Managed Care – PPO | Admitting: Anesthesiology

## 2014-03-11 DIAGNOSIS — Z1211 Encounter for screening for malignant neoplasm of colon: Secondary | ICD-10-CM

## 2014-08-31 ENCOUNTER — Encounter: Payer: Self-pay | Admitting: Internal Medicine

## 2014-09-21 ENCOUNTER — Encounter: Payer: Self-pay | Admitting: Gynecology

## 2015-01-25 ENCOUNTER — Other Ambulatory Visit: Payer: Self-pay

## 2015-01-25 DIAGNOSIS — Z1231 Encounter for screening mammogram for malignant neoplasm of breast: Secondary | ICD-10-CM

## 2015-02-11 ENCOUNTER — Ambulatory Visit
Admission: RE | Admit: 2015-02-11 | Discharge: 2015-02-11 | Disposition: A | Discharge status: Home / Self Care | Payer: BLUE CROSS/BLUE SHIELD | Source: Ambulatory Visit

## 2015-02-11 DIAGNOSIS — Z1231 Encounter for screening mammogram for malignant neoplasm of breast: Secondary | ICD-10-CM

## 2016-02-14 ENCOUNTER — Other Ambulatory Visit: Payer: Self-pay

## 2016-02-14 DIAGNOSIS — Z1231 Encounter for screening mammogram for malignant neoplasm of breast: Secondary | ICD-10-CM

## 2016-03-27 ENCOUNTER — Ambulatory Visit
Admission: RE | Admit: 2016-03-27 | Discharge: 2016-03-27 | Disposition: A | Discharge status: Home / Self Care | Payer: BLUE CROSS/BLUE SHIELD | Source: Ambulatory Visit

## 2016-03-27 DIAGNOSIS — Z1231 Encounter for screening mammogram for malignant neoplasm of breast: Secondary | ICD-10-CM

## 2016-04-21 DIAGNOSIS — L814 Other melanin hyperpigmentation: Secondary | ICD-10-CM | POA: Diagnosis not present

## 2016-04-21 DIAGNOSIS — L821 Other seborrheic keratosis: Secondary | ICD-10-CM | POA: Diagnosis not present

## 2016-04-21 DIAGNOSIS — D2372 Other benign neoplasm of skin of left lower limb, including hip: Secondary | ICD-10-CM | POA: Diagnosis not present

## 2016-04-21 DIAGNOSIS — L570 Actinic keratosis: Secondary | ICD-10-CM | POA: Diagnosis not present

## 2016-08-25 DIAGNOSIS — Z23 Encounter for immunization: Secondary | ICD-10-CM | POA: Diagnosis not present

## 2016-10-19 DIAGNOSIS — Z0000 Encounter for general adult medical examination without abnormal findings: Secondary | ICD-10-CM | POA: Diagnosis not present

## 2016-10-19 DIAGNOSIS — N390 Urinary tract infection, site not specified: Secondary | ICD-10-CM | POA: Diagnosis not present

## 2016-10-19 DIAGNOSIS — E559 Vitamin D deficiency, unspecified: Secondary | ICD-10-CM | POA: Diagnosis not present

## 2016-10-27 DIAGNOSIS — Z6829 Body mass index (BMI) 29.0-29.9, adult: Secondary | ICD-10-CM | POA: Diagnosis not present

## 2016-10-27 DIAGNOSIS — E784 Other hyperlipidemia: Secondary | ICD-10-CM | POA: Diagnosis not present

## 2016-10-27 DIAGNOSIS — E559 Vitamin D deficiency, unspecified: Secondary | ICD-10-CM | POA: Diagnosis not present

## 2016-10-27 DIAGNOSIS — Z803 Family history of malignant neoplasm of breast: Secondary | ICD-10-CM | POA: Diagnosis not present

## 2016-10-27 DIAGNOSIS — Z0000 Encounter for general adult medical examination without abnormal findings: Secondary | ICD-10-CM | POA: Diagnosis not present

## 2016-11-07 DIAGNOSIS — Z1212 Encounter for screening for malignant neoplasm of rectum: Secondary | ICD-10-CM | POA: Diagnosis not present

## 2017-01-17 DIAGNOSIS — H5203 Hypermetropia, bilateral: Secondary | ICD-10-CM | POA: Diagnosis not present

## 2017-01-17 DIAGNOSIS — H2513 Age-related nuclear cataract, bilateral: Secondary | ICD-10-CM | POA: Diagnosis not present

## 2017-01-17 DIAGNOSIS — H401432 Capsular glaucoma with pseudoexfoliation of lens, bilateral, moderate stage: Secondary | ICD-10-CM | POA: Diagnosis not present

## 2017-01-17 DIAGNOSIS — H524 Presbyopia: Secondary | ICD-10-CM | POA: Diagnosis not present

## 2017-03-27 ENCOUNTER — Other Ambulatory Visit: Payer: Self-pay | Admitting: Internal Medicine

## 2017-03-27 DIAGNOSIS — Z1231 Encounter for screening mammogram for malignant neoplasm of breast: Secondary | ICD-10-CM

## 2017-04-04 ENCOUNTER — Encounter: Payer: Self-pay | Admitting: Gynecology

## 2017-04-18 ENCOUNTER — Ambulatory Visit
Admission: RE | Admit: 2017-04-18 | Discharge: 2017-04-18 | Disposition: A | Discharge status: Home / Self Care | Payer: BLUE CROSS/BLUE SHIELD | Source: Ambulatory Visit | Attending: Internal Medicine | Admitting: Internal Medicine

## 2017-04-18 DIAGNOSIS — Z1231 Encounter for screening mammogram for malignant neoplasm of breast: Secondary | ICD-10-CM | POA: Diagnosis not present

## 2017-05-09 DIAGNOSIS — L570 Actinic keratosis: Secondary | ICD-10-CM | POA: Diagnosis not present

## 2017-05-09 DIAGNOSIS — L821 Other seborrheic keratosis: Secondary | ICD-10-CM | POA: Diagnosis not present

## 2017-05-09 DIAGNOSIS — L239 Allergic contact dermatitis, unspecified cause: Secondary | ICD-10-CM | POA: Diagnosis not present

## 2017-05-09 DIAGNOSIS — D1801 Hemangioma of skin and subcutaneous tissue: Secondary | ICD-10-CM | POA: Diagnosis not present

## 2017-05-09 DIAGNOSIS — L918 Other hypertrophic disorders of the skin: Secondary | ICD-10-CM | POA: Diagnosis not present

## 2017-05-09 DIAGNOSIS — D225 Melanocytic nevi of trunk: Secondary | ICD-10-CM | POA: Diagnosis not present

## 2017-09-03 DIAGNOSIS — Z23 Encounter for immunization: Secondary | ICD-10-CM | POA: Diagnosis not present

## 2017-10-25 DIAGNOSIS — E7849 Other hyperlipidemia: Secondary | ICD-10-CM | POA: Diagnosis not present

## 2017-10-25 DIAGNOSIS — Z0000 Encounter for general adult medical examination without abnormal findings: Secondary | ICD-10-CM | POA: Diagnosis not present

## 2017-10-25 DIAGNOSIS — E559 Vitamin D deficiency, unspecified: Secondary | ICD-10-CM | POA: Diagnosis not present

## 2017-11-02 DIAGNOSIS — Z803 Family history of malignant neoplasm of breast: Secondary | ICD-10-CM | POA: Diagnosis not present

## 2017-11-02 DIAGNOSIS — Z0000 Encounter for general adult medical examination without abnormal findings: Secondary | ICD-10-CM | POA: Diagnosis not present

## 2017-11-02 DIAGNOSIS — R82998 Other abnormal findings in urine: Secondary | ICD-10-CM | POA: Diagnosis not present

## 2017-11-02 DIAGNOSIS — Z1389 Encounter for screening for other disorder: Secondary | ICD-10-CM | POA: Diagnosis not present

## 2017-11-02 DIAGNOSIS — E559 Vitamin D deficiency, unspecified: Secondary | ICD-10-CM | POA: Diagnosis not present

## 2017-11-02 DIAGNOSIS — E7849 Other hyperlipidemia: Secondary | ICD-10-CM | POA: Diagnosis not present

## 2017-11-02 DIAGNOSIS — Z87448 Personal history of other diseases of urinary system: Secondary | ICD-10-CM | POA: Diagnosis not present

## 2017-11-02 DIAGNOSIS — Z1212 Encounter for screening for malignant neoplasm of rectum: Secondary | ICD-10-CM | POA: Diagnosis not present

## 2017-12-04 DIAGNOSIS — Z01419 Encounter for gynecological examination (general) (routine) without abnormal findings: Secondary | ICD-10-CM | POA: Diagnosis not present

## 2017-12-04 DIAGNOSIS — Z124 Encounter for screening for malignant neoplasm of cervix: Secondary | ICD-10-CM | POA: Diagnosis not present

## 2017-12-04 DIAGNOSIS — Z803 Family history of malignant neoplasm of breast: Secondary | ICD-10-CM | POA: Diagnosis not present

## 2018-01-23 DIAGNOSIS — H401432 Capsular glaucoma with pseudoexfoliation of lens, bilateral, moderate stage: Secondary | ICD-10-CM | POA: Diagnosis not present

## 2018-01-23 DIAGNOSIS — H5203 Hypermetropia, bilateral: Secondary | ICD-10-CM | POA: Diagnosis not present

## 2018-01-23 DIAGNOSIS — H04123 Dry eye syndrome of bilateral lacrimal glands: Secondary | ICD-10-CM | POA: Diagnosis not present

## 2018-01-23 DIAGNOSIS — H2513 Age-related nuclear cataract, bilateral: Secondary | ICD-10-CM | POA: Diagnosis not present

## 2018-06-26 DIAGNOSIS — L570 Actinic keratosis: Secondary | ICD-10-CM | POA: Diagnosis not present

## 2018-06-26 DIAGNOSIS — L720 Epidermal cyst: Secondary | ICD-10-CM | POA: Diagnosis not present

## 2018-06-26 DIAGNOSIS — D2272 Melanocytic nevi of left lower limb, including hip: Secondary | ICD-10-CM | POA: Diagnosis not present

## 2018-09-02 ENCOUNTER — Other Ambulatory Visit: Payer: Self-pay | Admitting: Internal Medicine

## 2018-09-02 DIAGNOSIS — Z1231 Encounter for screening mammogram for malignant neoplasm of breast: Secondary | ICD-10-CM

## 2018-09-19 DIAGNOSIS — L237 Allergic contact dermatitis due to plants, except food: Secondary | ICD-10-CM | POA: Diagnosis not present

## 2018-10-07 ENCOUNTER — Ambulatory Visit
Admission: RE | Admit: 2018-10-07 | Discharge: 2018-10-07 | Disposition: A | Discharge status: Home / Self Care | Payer: BLUE CROSS/BLUE SHIELD | Source: Ambulatory Visit | Attending: Internal Medicine | Admitting: Internal Medicine

## 2018-10-07 DIAGNOSIS — Z1231 Encounter for screening mammogram for malignant neoplasm of breast: Secondary | ICD-10-CM | POA: Diagnosis not present

## 2018-10-30 DIAGNOSIS — E559 Vitamin D deficiency, unspecified: Secondary | ICD-10-CM | POA: Diagnosis not present

## 2018-10-30 DIAGNOSIS — Z0000 Encounter for general adult medical examination without abnormal findings: Secondary | ICD-10-CM | POA: Diagnosis not present

## 2018-10-30 DIAGNOSIS — E7849 Other hyperlipidemia: Secondary | ICD-10-CM | POA: Diagnosis not present

## 2018-10-30 DIAGNOSIS — R82998 Other abnormal findings in urine: Secondary | ICD-10-CM | POA: Diagnosis not present

## 2018-11-06 DIAGNOSIS — Z1389 Encounter for screening for other disorder: Secondary | ICD-10-CM | POA: Diagnosis not present

## 2018-11-06 DIAGNOSIS — Z0000 Encounter for general adult medical examination without abnormal findings: Secondary | ICD-10-CM | POA: Diagnosis not present

## 2018-11-06 DIAGNOSIS — E7849 Other hyperlipidemia: Secondary | ICD-10-CM | POA: Diagnosis not present

## 2018-11-06 DIAGNOSIS — E559 Vitamin D deficiency, unspecified: Secondary | ICD-10-CM | POA: Diagnosis not present

## 2018-11-06 DIAGNOSIS — Z803 Family history of malignant neoplasm of breast: Secondary | ICD-10-CM | POA: Diagnosis not present

## 2018-11-08 DIAGNOSIS — Z1212 Encounter for screening for malignant neoplasm of rectum: Secondary | ICD-10-CM | POA: Diagnosis not present

## 2019-04-22 DIAGNOSIS — H25812 Combined forms of age-related cataract, left eye: Secondary | ICD-10-CM | POA: Diagnosis not present

## 2019-08-07 DIAGNOSIS — D1801 Hemangioma of skin and subcutaneous tissue: Secondary | ICD-10-CM | POA: Diagnosis not present

## 2019-08-07 DIAGNOSIS — L821 Other seborrheic keratosis: Secondary | ICD-10-CM | POA: Diagnosis not present

## 2019-08-07 DIAGNOSIS — L820 Inflamed seborrheic keratosis: Secondary | ICD-10-CM | POA: Diagnosis not present

## 2019-08-07 DIAGNOSIS — L570 Actinic keratosis: Secondary | ICD-10-CM | POA: Diagnosis not present

## 2019-08-07 DIAGNOSIS — D225 Melanocytic nevi of trunk: Secondary | ICD-10-CM | POA: Diagnosis not present

## 2019-08-07 DIAGNOSIS — D2271 Melanocytic nevi of right lower limb, including hip: Secondary | ICD-10-CM | POA: Diagnosis not present

## 2019-08-07 DIAGNOSIS — L918 Other hypertrophic disorders of the skin: Secondary | ICD-10-CM | POA: Diagnosis not present

## 2019-08-07 DIAGNOSIS — L814 Other melanin hyperpigmentation: Secondary | ICD-10-CM | POA: Diagnosis not present

## 2019-09-17 ENCOUNTER — Other Ambulatory Visit: Payer: Self-pay | Admitting: Internal Medicine

## 2019-09-17 DIAGNOSIS — Z1231 Encounter for screening mammogram for malignant neoplasm of breast: Secondary | ICD-10-CM

## 2019-11-04 DIAGNOSIS — E559 Vitamin D deficiency, unspecified: Secondary | ICD-10-CM | POA: Diagnosis not present

## 2019-11-04 DIAGNOSIS — E7849 Other hyperlipidemia: Secondary | ICD-10-CM | POA: Diagnosis not present

## 2019-11-05 DIAGNOSIS — R82998 Other abnormal findings in urine: Secondary | ICD-10-CM | POA: Diagnosis not present

## 2019-11-06 ENCOUNTER — Other Ambulatory Visit: Payer: Self-pay

## 2019-11-06 ENCOUNTER — Ambulatory Visit
Admission: RE | Admit: 2019-11-06 | Discharge: 2019-11-06 | Disposition: A | Discharge status: Home / Self Care | Payer: BLUE CROSS/BLUE SHIELD | Source: Ambulatory Visit | Attending: Internal Medicine | Admitting: Internal Medicine

## 2019-11-06 DIAGNOSIS — Z1231 Encounter for screening mammogram for malignant neoplasm of breast: Secondary | ICD-10-CM | POA: Diagnosis not present

## 2019-11-11 DIAGNOSIS — E559 Vitamin D deficiency, unspecified: Secondary | ICD-10-CM | POA: Diagnosis not present

## 2019-11-11 DIAGNOSIS — Z0000 Encounter for general adult medical examination without abnormal findings: Secondary | ICD-10-CM | POA: Diagnosis not present

## 2019-11-11 DIAGNOSIS — Z803 Family history of malignant neoplasm of breast: Secondary | ICD-10-CM | POA: Diagnosis not present

## 2019-11-11 DIAGNOSIS — M25559 Pain in unspecified hip: Secondary | ICD-10-CM | POA: Diagnosis not present

## 2019-11-11 DIAGNOSIS — E785 Hyperlipidemia, unspecified: Secondary | ICD-10-CM | POA: Diagnosis not present

## 2019-11-13 DIAGNOSIS — Z1212 Encounter for screening for malignant neoplasm of rectum: Secondary | ICD-10-CM | POA: Diagnosis not present

## 2019-12-01 DIAGNOSIS — M1611 Unilateral primary osteoarthritis, right hip: Secondary | ICD-10-CM | POA: Diagnosis not present

## 2019-12-01 DIAGNOSIS — M25551 Pain in right hip: Secondary | ICD-10-CM | POA: Diagnosis not present

## 2019-12-11 DIAGNOSIS — H25811 Combined forms of age-related cataract, right eye: Secondary | ICD-10-CM | POA: Diagnosis not present

## 2019-12-11 DIAGNOSIS — H43811 Vitreous degeneration, right eye: Secondary | ICD-10-CM | POA: Diagnosis not present

## 2019-12-11 DIAGNOSIS — Z961 Presence of intraocular lens: Secondary | ICD-10-CM | POA: Diagnosis not present

## 2019-12-11 DIAGNOSIS — H5201 Hypermetropia, right eye: Secondary | ICD-10-CM | POA: Diagnosis not present

## 2019-12-25 DIAGNOSIS — M1611 Unilateral primary osteoarthritis, right hip: Secondary | ICD-10-CM | POA: Diagnosis not present

## 2020-01-12 DIAGNOSIS — M1611 Unilateral primary osteoarthritis, right hip: Secondary | ICD-10-CM | POA: Diagnosis not present

## 2020-01-16 DIAGNOSIS — M1611 Unilateral primary osteoarthritis, right hip: Secondary | ICD-10-CM | POA: Diagnosis not present

## 2020-01-17 DIAGNOSIS — Z96641 Presence of right artificial hip joint: Secondary | ICD-10-CM | POA: Diagnosis not present

## 2020-01-17 DIAGNOSIS — M1611 Unilateral primary osteoarthritis, right hip: Secondary | ICD-10-CM | POA: Diagnosis not present

## 2020-02-02 DIAGNOSIS — Z471 Aftercare following joint replacement surgery: Secondary | ICD-10-CM | POA: Diagnosis not present

## 2020-02-02 DIAGNOSIS — Z96641 Presence of right artificial hip joint: Secondary | ICD-10-CM | POA: Diagnosis not present

## 2020-03-01 DIAGNOSIS — Z96641 Presence of right artificial hip joint: Secondary | ICD-10-CM | POA: Diagnosis not present

## 2020-03-01 DIAGNOSIS — Z471 Aftercare following joint replacement surgery: Secondary | ICD-10-CM | POA: Diagnosis not present

## 2020-04-23 IMAGING — MG DIGITAL SCREENING BILATERAL MAMMOGRAM WITH TOMO AND CAD
8 series · 8 of 24 positions shown · non-contrast
Comparison: Previous exam(s).

CLINICAL DATA: Screening.

EXAM:
DIGITAL SCREENING BILATERAL MAMMOGRAM WITH TOMO AND CAD

[L MLO synth-2D]
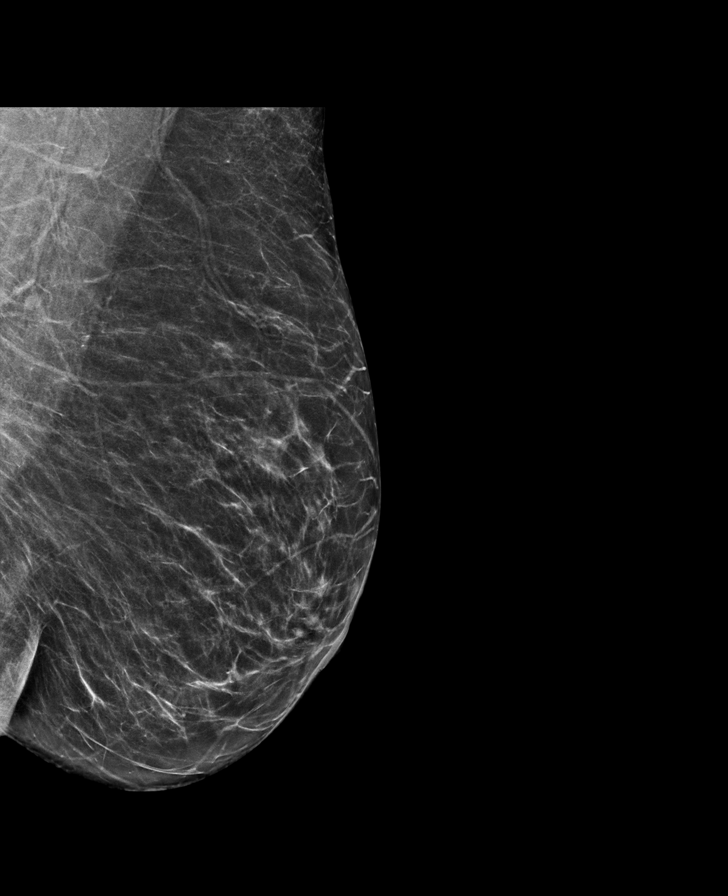

[R MLO synth-2D]
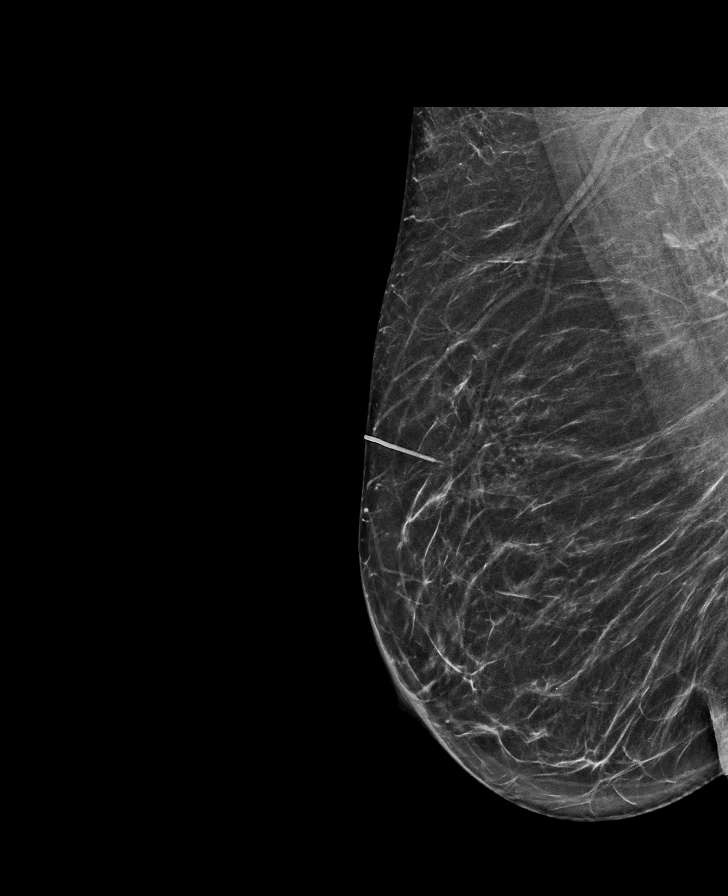

[L CC synth-2D]
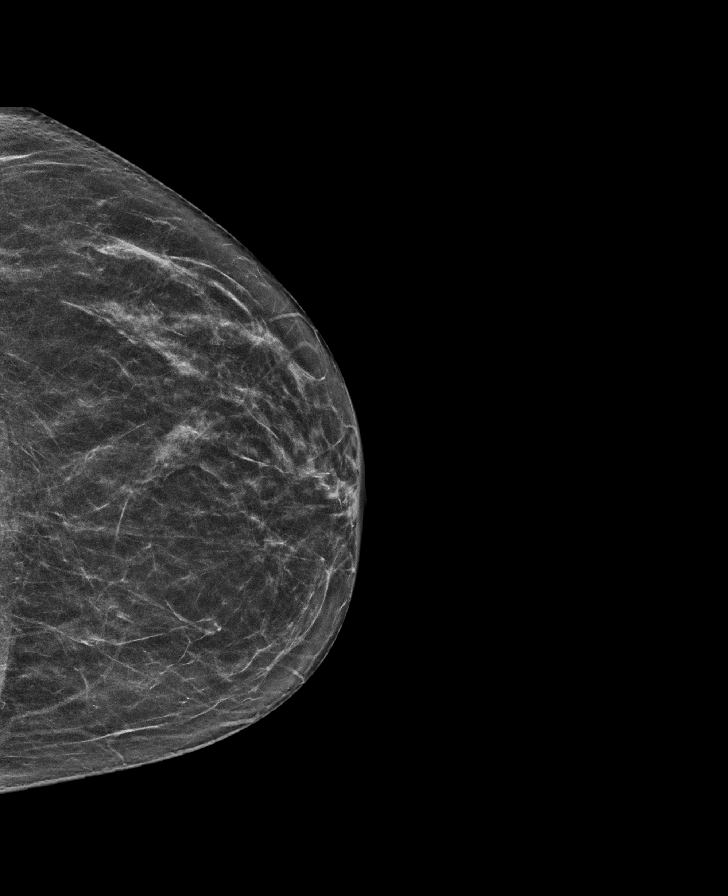

[R CC synth-2D]
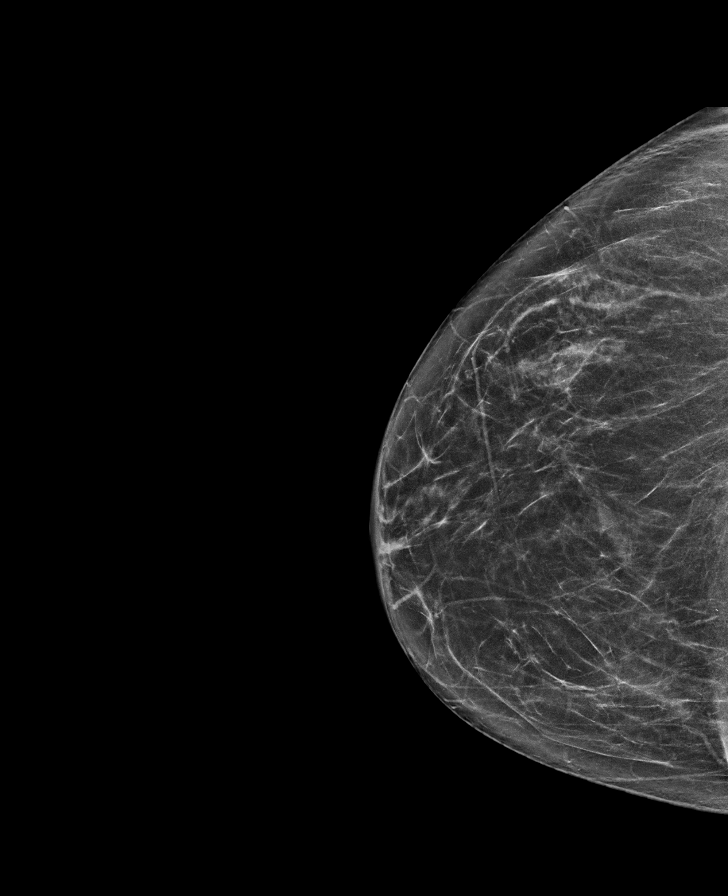

[R CC tomo · tomo slice 35/68.0]
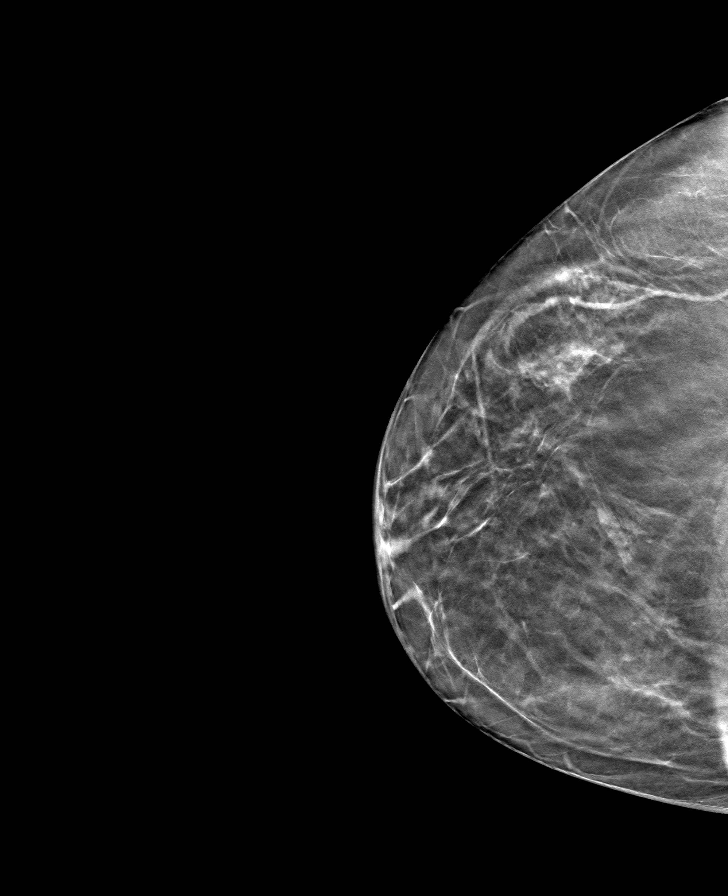

[R MLO tomo · tomo slice 35/70.0]
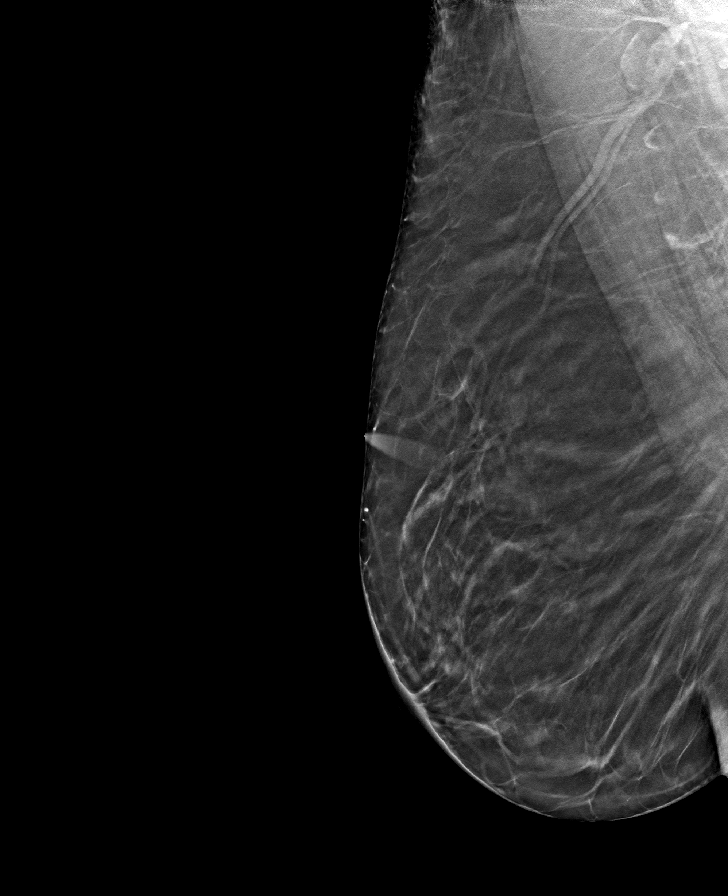

[L CC tomo · tomo slice 31/62.0]
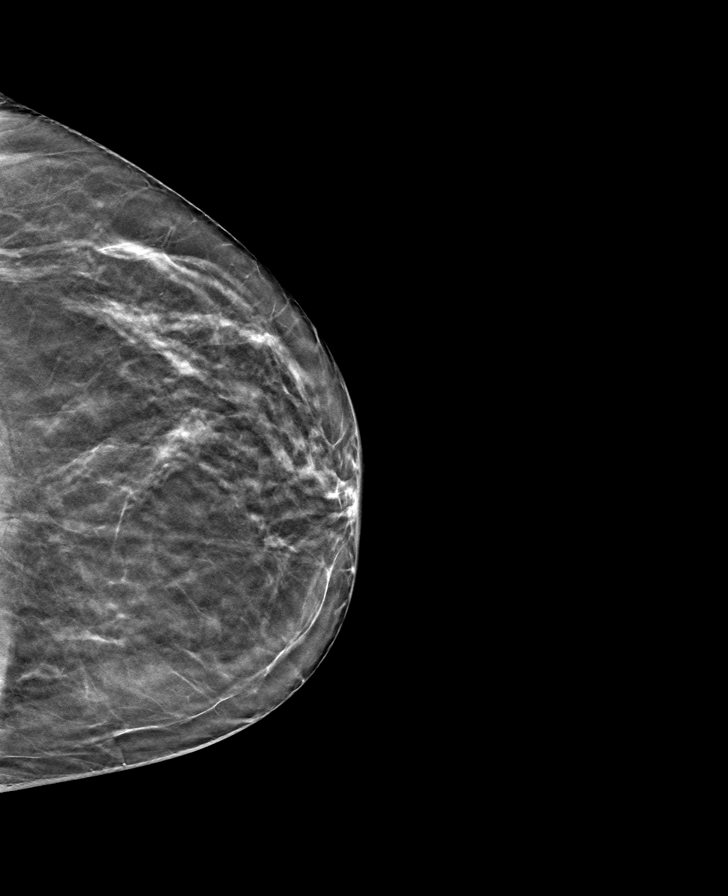

[L MLO tomo · tomo slice 35/70.0]
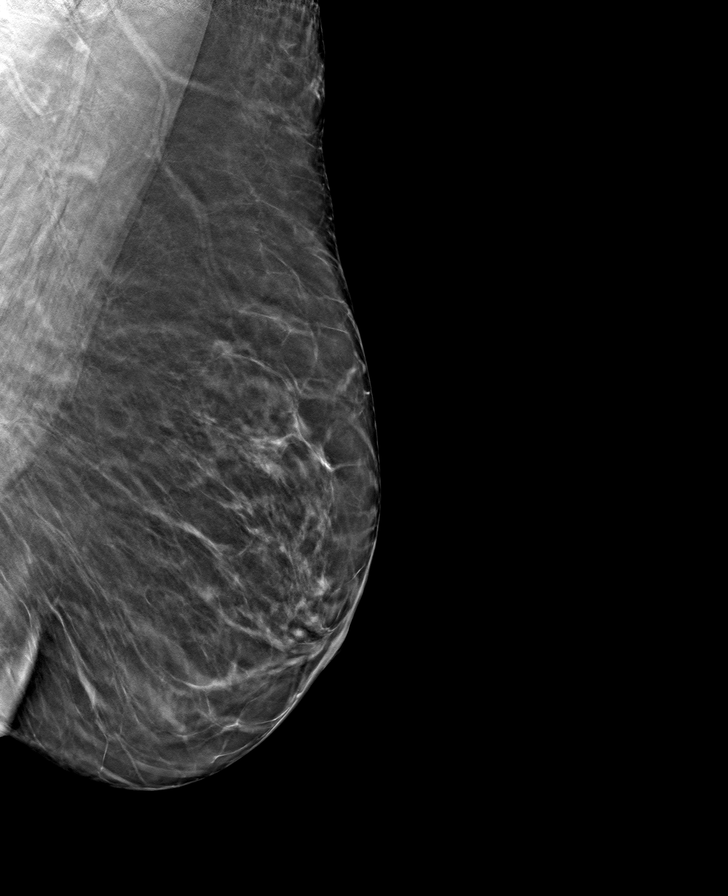

[8 of 24 positions shown; findings below may reference images not displayed]

ACR Breast Density Category b: There are scattered areas of
fibroglandular density.
FINDINGS: There are no findings suspicious for malignancy. Images were
processed with CAD.
IMPRESSION: No mammographic evidence of malignancy. A result letter of this
screening mammogram will be mailed directly to the patient.

RECOMMENDATION:
Screening mammogram in one year. (Code:[id number])

BI-RADS CATEGORY  1: Negative.

## 2020-05-30 ENCOUNTER — Encounter (HOSPITAL_BASED_OUTPATIENT_CLINIC_OR_DEPARTMENT_OTHER): Payer: Self-pay

## 2020-05-30 ENCOUNTER — Other Ambulatory Visit: Payer: Self-pay

## 2020-05-30 ENCOUNTER — Emergency Department (HOSPITAL_BASED_OUTPATIENT_CLINIC_OR_DEPARTMENT_OTHER): Payer: PPO

## 2020-05-30 ENCOUNTER — Emergency Department (HOSPITAL_BASED_OUTPATIENT_CLINIC_OR_DEPARTMENT_OTHER)
Admission: EM | Admit: 2020-05-30 | Discharge: 2020-05-30 | Disposition: A | Discharge status: Home / Self Care | Payer: PPO | Attending: Emergency Medicine | Admitting: Emergency Medicine

## 2020-05-30 DIAGNOSIS — S61211A Laceration without foreign body of left index finger without damage to nail, initial encounter: Secondary | ICD-10-CM

## 2020-05-30 DIAGNOSIS — Z23 Encounter for immunization: Secondary | ICD-10-CM | POA: Insufficient documentation

## 2020-05-30 DIAGNOSIS — Y999 Unspecified external cause status: Secondary | ICD-10-CM | POA: Diagnosis not present

## 2020-05-30 DIAGNOSIS — Y93E5 Activity, floor mopping and cleaning: Secondary | ICD-10-CM | POA: Insufficient documentation

## 2020-05-30 DIAGNOSIS — W293XXA Contact with powered garden and outdoor hand tools and machinery, initial encounter: Secondary | ICD-10-CM | POA: Insufficient documentation

## 2020-05-30 DIAGNOSIS — Z87891 Personal history of nicotine dependence: Secondary | ICD-10-CM | POA: Insufficient documentation

## 2020-05-30 DIAGNOSIS — Y92009 Unspecified place in unspecified non-institutional (private) residence as the place of occurrence of the external cause: Secondary | ICD-10-CM | POA: Diagnosis not present

## 2020-05-30 MED ORDER — LIDOCAINE HCL (PF) 1 % IJ SOLN
5.0000 mL | Freq: Once | INTRAMUSCULAR | Status: AC
  Administered 2020-05-30: 5 mL via INTRADERMAL
  Filled 2020-05-30: qty 5

## 2020-05-30 MED ORDER — CEPHALEXIN 500 MG PO CAPS
500.0000 mg | ORAL_CAPSULE | Freq: Four times a day (QID) | ORAL | 0 refills | Status: AC
Start: 2020-05-30 — End: 2020-06-06

## 2020-05-30 MED ORDER — TETANUS-DIPHTH-ACELL PERTUSSIS 5-2.5-18.5 LF-MCG/0.5 IM SUSP
0.5000 mL | Freq: Once | INTRAMUSCULAR | Status: AC
  Administered 2020-05-30: 0.5 mL via INTRAMUSCULAR
  Filled 2020-05-30: qty 0.5

## 2020-05-30 MED ORDER — CEPHALEXIN 250 MG PO CAPS
500.0000 mg | ORAL_CAPSULE | Freq: Once | ORAL | Status: AC
  Administered 2020-05-30: 500 mg via ORAL
  Filled 2020-05-30: qty 2

## 2020-05-30 MED ORDER — BACITRACIN ZINC 500 UNIT/GM EX OINT
TOPICAL_OINTMENT | Freq: Once | CUTANEOUS | Status: AC
  Administered 2020-05-30: 1 via TOPICAL
  Filled 2020-05-30: qty 28.35

## 2020-05-30 NOTE — ED Provider Notes (Signed)
[facility] EMERGENCY DEPARTMENT Provider Note   CSN: [id number] Arrival date & time: 05/30/20  1046     History Chief Complaint  Patient presents with  . Laceration    Carolyn Moss is a 64 y.o. female who presents for evaluation of laceration to left index finger that occurred this morning.  She reports that she was using an Chief of Staff and states that her finger got caught, causing a laceration to the volar aspect of the distal left index finger.  She reports that she was wearing gloves at the time.  She states that she cleaned out the area but states that it continued bleeding, prompting ED visit.  She states that her last tetanus was about 7 years ago.  She is not on blood thinners.  She denies any numbness/weakness.  The history is provided by the patient.       Past Medical History:  Diagnosis Date  . Endometriosis    Endometrioma-Right ovary  . Osteopenia 12/2011   t score -1.1 Frax 13%/0.4    Patient Active Problem List   Diagnosis Date Noted  . H/O vitamin D deficiency 02/17/2014  . Osteopenia 02/17/2014  . Menopause 02/17/2014  . Endometriosis     Past Surgical History:  Procedure Laterality Date  . BREAST EXCISIONAL BIOPSY Right 04/26/1998   benign  . BREAST SURGERY     Biopsy-benign  . JOINT REPLACEMENT    . OOPHORECTOMY     RSO  . PELVIC LAPAROSCOPY     DL RSO  . TUBAL LIGATION       OB History    Gravida  3   Para  3   Term  3   Preterm      AB      Living  3     SAB      TAB      Ectopic      Multiple      Live Births              Family History  Problem Relation Age of Onset  . Breast cancer Mother 54  . Breast cancer Sister 41  . Breast cancer Maternal Grandmother 9    Social History   Tobacco Use  . Smoking status: Former Research scientist (life sciences)  . Smokeless tobacco: Never Used  Substance Use Topics  . Alcohol use: Yes    Alcohol/week: 14.0 standard drinks    Types: 14 Standard drinks or equivalent  per week  . Drug use: Never    Home Medications Prior to Admission medications   Medication Sig Start Date End Date Taking? Authorizing Provider  cephALEXin (KEFLEX) 500 MG capsule Take 1 capsule (500 mg total) by mouth 4 (four) times daily for 7 days. 05/30/20 06/06/20  Volanda Napoleon, PA-C  ciprofloxacin (CIPRO) 250 MG tablet Take 1 tablet (250 mg total) by mouth 2 (two) times daily. 02/24/14   Terrance Mass, MD  Vitamin D, Ergocalciferol, (DRISDOL) 50000 UNITS CAPS capsule Take 1 capsule (50,000 Units total) by mouth every 7 (seven) days. 02/19/14   Terrance Mass, MD    Allergies    Patient has no known allergies.  Review of Systems   Review of Systems  Skin: Positive for wound.  Neurological: Negative for weakness and numbness.  All other systems reviewed and are negative.   Physical Exam Updated Vital Signs BP 138/81 (BP Location: Left Arm)   Pulse 84   Temp 98.4 F (36.9 C) (Oral)  Resp 20   Ht 5\' 2"  (1.575 m)   Wt 72.6 kg   SpO2 99%   BMI 29.26 kg/m   Physical Exam Vitals and nursing note reviewed.  Constitutional:      Appearance: She is well-developed.  HENT:     Head: Normocephalic and atraumatic.  Eyes:     General: No scleral icterus.       Right eye: No discharge.        Left eye: No discharge.     Conjunctiva/sclera: Conjunctivae normal.  Cardiovascular:     Pulses:          Radial pulses are 2+ on the right side and 2+ on the left side.  Pulmonary:     Effort: Pulmonary effort is normal.  Musculoskeletal:     Comments: No bony tenderness noted to the left index finger.  Full flexion/extension of both the DIP and PIP of the left index finger intact without any difficulty.  No other tenderness palpation.  No bony deformity.  Skin:    General: Skin is warm and dry.     Capillary Refill: Capillary refill takes less than 2 seconds.     Comments: Good distal cap refill.  LUE is not dusky in appearance or cool to touch.  Neurological:     Mental  Status: She is alert.     Comments: Sensation intact along major nerve distributions of BUE  Psychiatric:        Speech: Speech normal.        Behavior: Behavior normal.     ED Results / Procedures / Treatments   Labs (all labs ordered are listed, but only abnormal results are displayed) Labs Reviewed - No data to display  EKG None  Radiology DG Finger Index Left  Result Date: 05/30/2020 CLINICAL DATA:  Laceration to distal index finger today. Initial encounter. EXAM: LEFT INDEX FINGER 2+V COMPARISON:  None. FINDINGS: There is no evidence of fracture or dislocation. There is no evidence of arthropathy or other focal bone abnormality. Soft tissues are unremarkable. There is no evidence of radiopaque foreign body. IMPRESSION: Negative. Electronically Signed   By: Margarette Canada M.D.   On: 05/30/2020 11:48    Procedures .Marland KitchenLaceration Repair  Date/Time: 05/30/2020 11:50 AM Performed by: Volanda Napoleon, PA-C Authorized by: Volanda Napoleon, PA-C   Consent:    Consent obtained:  Verbal   Consent given by:  Patient   Risks discussed:  Infection, need for additional repair, pain, poor cosmetic result and poor wound healing   Alternatives discussed:  No treatment and delayed treatment Universal protocol:    Procedure explained and questions answered to patient or proxy's satisfaction: yes     Relevant documents present and verified: yes     Test results available and properly labeled: yes     Imaging studies available: yes     Required blood products, implants, devices, and special equipment available: yes     Site/side marked: yes     Immediately prior to procedure, a time out was called: yes     Patient identity confirmed:  Verbally with patient Anesthesia (see MAR for exact dosages):    Anesthesia method:  Local infiltration   Local anesthetic:  Lidocaine 1% w/o epi Laceration details:    Location:  Finger   Finger location:  L index finger   Length (cm):  1.5 Repair type:      Repair type:  Intermediate Pre-procedure details:    Preparation:  Imaging  obtained to evaluate for foreign bodies and patient was prepped and draped in usual sterile fashion Exploration:    Hemostasis achieved with:  Direct pressure   Wound extent: no foreign bodies/material noted and no tendon damage noted   Treatment:    Area cleansed with:  Betadine   Amount of cleaning:  Extensive   Irrigation solution:  Sterile saline   Irrigation method:  Syringe   Visualized foreign bodies/material removed: no   Skin repair:    Repair method:  Sutures   Suture size:  5-0   Wound skin closure material used: Vicryl.   Suture technique:  Simple interrupted   Number of sutures:  5 Approximation:    Approximation:  Close Post-procedure details:    Dressing:  Antibiotic ointment and non-adherent dressing   Patient tolerance of procedure:  Tolerated well, no immediate complications Comments:     Once the wound was anesthetized, it was thoroughly extensively irrigated with sterile saline.  The wound was explored to form to motion when showed no evidence of foreign body.  No evidence of tendon injury.  Laceration repaired as documented above.   (including critical care time)  Medications Ordered in ED Medications  cephALEXin (KEFLEX) capsule 500 mg (has no administration in time range)  bacitracin ointment (has no administration in time range)  Tdap (BOOSTRIX) injection 0.5 mL (0.5 mLs Intramuscular Given 05/30/20 1140)  lidocaine (PF) (XYLOCAINE) 1 % injection 5 mL (5 mLs Intradermal Given 05/30/20 1141)    ED Course  I have reviewed the triage vital signs and the nursing notes.  Pertinent labs & imaging results that were available during my care of the patient were reviewed by me and considered in my medical decision making (see chart for details).    MDM Rules/Calculators/A&P                          64 year old female who presents for evaluation of right index finger laceration that  occurred this morning.  Patient states that she was using an Chief of Staff and states that it hit her finger.  She was wearing gloves at the time.  She is not on blood thinners.  Denies any numbness/weakness.  On initial arrival, she is afebrile, nontoxic-appearing.  Vital signs are stable.  On exam, she has a small 1.5 cm curvilinear laceration on dorsal aspect of the left index finger distal to the DIP joint.  Plan for wound care, update tetanus, x-ray.  X-ray reviewed.  Negative for any acute bony abnormality.  Laceration repaired as documented above.  Patient tolerated procedure well.  Encouraged at home supportive care measures.  Patient with recent hip surgery and given electric hedge tremor that was dirty, plan for antibiotics. Patient with no known drug allergies. At this time, patient exhibits no emergent life-threatening condition that require further evaluation in ED or admission. Patient had ample opportunity for questions and discussion. All patient's questions were answered with full understanding. Strict return precautions discussed. Patient expresses understanding and agreement to plan.   Portions of this note were generated with Lobbyist. Dictation errors may occur despite best attempts at proofreading.  Final Clinical Impression(s) / ED Diagnoses Final diagnoses:  Laceration of left index finger without foreign body without damage to nail, initial encounter    Rx / DC Orders ED Discharge Orders         Ordered    cephALEXin (KEFLEX) 500 MG capsule  4 times daily  Discontinue  Reprint     05/30/20 1220           Desma Mcgregor 05/30/20 1232    Dorie Rank, MD 05/31/20 1005

## 2020-05-30 NOTE — ED Notes (Signed)
ED Provider at bedside. 

## 2020-05-30 NOTE — Discharge Instructions (Signed)
Keep the wound clean and dry for the first 24 hours. After that you may gently clean the wound with soap and water. Make sure to pat dry the wound before covering it with any dressing. You can use topical antibiotic ointment and bandage. Ice and elevate for pain relief.   You can take Tylenol or Ibuprofen as directed for pain. You can alternate Tylenol and Ibuprofen every 4 hours for additional pain relief.   Take antibiotics as directed. Please take all of your antibiotics until finished.  As we discussed, in 5 days, you can gently tugged the tail ends of the stitches.  If you meet resistance, wait.  If they are ready to come out, they will come out easily.  If you meet resistance, wait another day.  They should not stay in the more than 7 days.  Monitor closely for any signs of infection. Return to the Emergency Department for any worsening redness/swelling of the area that begins to spread, drainage from the site, worsening pain, fever or any other worsening or concerning symptoms.

## 2020-05-30 NOTE — ED Triage Notes (Signed)
Pt arrives ambulatory to ED with laceration to left index finger today from an Chief of Staff. Pt states area was cleaned out at at home. Pt reports she last had her tetanus updated 7 years ago.

## 2020-09-13 DIAGNOSIS — L03213 Periorbital cellulitis: Secondary | ICD-10-CM | POA: Diagnosis not present

## 2020-09-13 DIAGNOSIS — H25811 Combined forms of age-related cataract, right eye: Secondary | ICD-10-CM | POA: Diagnosis not present

## 2020-09-13 DIAGNOSIS — Z961 Presence of intraocular lens: Secondary | ICD-10-CM | POA: Diagnosis not present

## 2020-09-13 DIAGNOSIS — H5231 Anisometropia: Secondary | ICD-10-CM | POA: Diagnosis not present

## 2020-10-11 DIAGNOSIS — Z96641 Presence of right artificial hip joint: Secondary | ICD-10-CM | POA: Diagnosis not present

## 2020-10-18 ENCOUNTER — Other Ambulatory Visit: Payer: Self-pay | Admitting: Internal Medicine

## 2020-10-18 DIAGNOSIS — Z1231 Encounter for screening mammogram for malignant neoplasm of breast: Secondary | ICD-10-CM

## 2020-11-10 DIAGNOSIS — D2262 Melanocytic nevi of left upper limb, including shoulder: Secondary | ICD-10-CM | POA: Diagnosis not present

## 2020-11-10 DIAGNOSIS — D1801 Hemangioma of skin and subcutaneous tissue: Secondary | ICD-10-CM | POA: Diagnosis not present

## 2020-11-10 DIAGNOSIS — L821 Other seborrheic keratosis: Secondary | ICD-10-CM | POA: Diagnosis not present

## 2020-11-10 DIAGNOSIS — L918 Other hypertrophic disorders of the skin: Secondary | ICD-10-CM | POA: Diagnosis not present

## 2020-11-10 DIAGNOSIS — L570 Actinic keratosis: Secondary | ICD-10-CM | POA: Diagnosis not present

## 2020-11-10 DIAGNOSIS — L853 Xerosis cutis: Secondary | ICD-10-CM | POA: Diagnosis not present

## 2020-11-10 DIAGNOSIS — D225 Melanocytic nevi of trunk: Secondary | ICD-10-CM | POA: Diagnosis not present

## 2020-11-10 DIAGNOSIS — L820 Inflamed seborrheic keratosis: Secondary | ICD-10-CM | POA: Diagnosis not present

## 2020-11-17 ENCOUNTER — Ambulatory Visit
Admission: RE | Admit: 2020-11-17 | Discharge: 2020-11-17 | Disposition: A | Discharge status: Home / Self Care | Payer: PPO | Source: Ambulatory Visit | Attending: Internal Medicine | Admitting: Internal Medicine

## 2020-11-17 ENCOUNTER — Other Ambulatory Visit: Payer: Self-pay

## 2020-11-17 DIAGNOSIS — Z1231 Encounter for screening mammogram for malignant neoplasm of breast: Secondary | ICD-10-CM

## 2020-11-24 DIAGNOSIS — E559 Vitamin D deficiency, unspecified: Secondary | ICD-10-CM | POA: Diagnosis not present

## 2020-11-24 DIAGNOSIS — E785 Hyperlipidemia, unspecified: Secondary | ICD-10-CM | POA: Diagnosis not present

## 2020-11-26 ENCOUNTER — Encounter: Payer: Self-pay | Admitting: Gastroenterology

## 2020-12-01 DIAGNOSIS — M1611 Unilateral primary osteoarthritis, right hip: Secondary | ICD-10-CM | POA: Diagnosis not present

## 2020-12-01 DIAGNOSIS — Z1212 Encounter for screening for malignant neoplasm of rectum: Secondary | ICD-10-CM | POA: Diagnosis not present

## 2020-12-01 DIAGNOSIS — Z1331 Encounter for screening for depression: Secondary | ICD-10-CM | POA: Diagnosis not present

## 2020-12-01 DIAGNOSIS — Z1339 Encounter for screening examination for other mental health and behavioral disorders: Secondary | ICD-10-CM | POA: Diagnosis not present

## 2020-12-01 DIAGNOSIS — E785 Hyperlipidemia, unspecified: Secondary | ICD-10-CM | POA: Diagnosis not present

## 2020-12-01 DIAGNOSIS — R82998 Other abnormal findings in urine: Secondary | ICD-10-CM | POA: Diagnosis not present

## 2020-12-01 DIAGNOSIS — E559 Vitamin D deficiency, unspecified: Secondary | ICD-10-CM | POA: Diagnosis not present

## 2020-12-01 DIAGNOSIS — Z0000 Encounter for general adult medical examination without abnormal findings: Secondary | ICD-10-CM | POA: Diagnosis not present

## 2021-01-19 ENCOUNTER — Ambulatory Visit (AMBULATORY_SURGERY_CENTER): Payer: Self-pay

## 2021-01-19 ENCOUNTER — Other Ambulatory Visit: Payer: Self-pay

## 2021-01-19 VITALS — Ht 62.0 in | Wt 172.0 lb

## 2021-01-19 DIAGNOSIS — Z1211 Encounter for screening for malignant neoplasm of colon: Secondary | ICD-10-CM

## 2021-01-19 MED ORDER — PLENVU 140 G PO SOLR
1.0000 | ORAL | 0 refills | Status: DC
Start: 2021-01-19 — End: 2021-02-02

## 2021-01-19 NOTE — Progress Notes (Signed)
No allergies to soy or egg Pt is not on blood thinners or diet pills Denies issues with sedation/intubation Denies atrial flutter/fib Denies constipation   Emmi instructions given to pt  Pt is aware of Covid safety and care partner requirements.  

## 2021-01-21 ENCOUNTER — Encounter: Payer: Self-pay | Admitting: Gastroenterology

## 2021-02-02 ENCOUNTER — Ambulatory Visit (AMBULATORY_SURGERY_CENTER): Payer: PPO | Admitting: Gastroenterology

## 2021-02-02 ENCOUNTER — Encounter: Payer: Self-pay | Admitting: Gastroenterology

## 2021-02-02 ENCOUNTER — Other Ambulatory Visit: Payer: Self-pay

## 2021-02-02 VITALS — BP 115/75 | HR 86 | Temp 97.5°F | Resp 12 | Ht 62.0 in | Wt 172.0 lb

## 2021-02-02 DIAGNOSIS — Z1211 Encounter for screening for malignant neoplasm of colon: Secondary | ICD-10-CM

## 2021-02-02 DIAGNOSIS — K635 Polyp of colon: Secondary | ICD-10-CM

## 2021-02-02 DIAGNOSIS — D123 Benign neoplasm of transverse colon: Secondary | ICD-10-CM

## 2021-02-02 MED ORDER — SODIUM CHLORIDE 0.9 % IV SOLN: 500.0000 mL | Freq: Once | INTRAVENOUS | Status: DC

## 2021-02-02 NOTE — Progress Notes (Signed)
[place]  Pt's states no medical or surgical changes since previsit or office visit.

## 2021-02-02 NOTE — Patient Instructions (Signed)
YOU HAD AN ENDOSCOPIC PROCEDURE TODAY AT THE [facility]:   Refer to the procedure report that was given to you for any specific questions about what was found during the examination.  If the procedure report does not answer your questions, please call your gastroenterologist to clarify.  If you requested that your care partner not be given the details of your procedure findings, then the procedure report has been included in a sealed envelope for you to review at your convenience later.  YOU SHOULD EXPECT: Some feelings of bloating in the abdomen. Passage of more gas than usual.  Walking can help get rid of the air that was put into your GI tract during the procedure and reduce the bloating. If you had a lower endoscopy (such as a colonoscopy or flexible sigmoidoscopy) you may notice spotting of blood in your stool or on the toilet paper. If you underwent a bowel prep for your procedure, you may not have a normal bowel movement for a few days.  Please Note:  You might notice some irritation and congestion in your nose or some drainage.  This is from the oxygen used during your procedure.  There is no need for concern and it should clear up in a day or so.  SYMPTOMS TO REPORT IMMEDIATELY:   Following lower endoscopy (colonoscopy or flexible sigmoidoscopy):  Excessive amounts of blood in the stool  Significant tenderness or worsening of abdominal pains  Swelling of the abdomen that is new, acute  Fever of 100F or higher   Following upper endoscopy (EGD)  Vomiting of blood or coffee ground material  New chest pain or pain under the shoulder blades  Painful or persistently difficult swallowing  New shortness of breath  Fever of 100F or higher  Black, tarry-looking stools  For urgent or emergent issues, a gastroenterologist can be reached at any hour by calling [phone]. Do not use MyChart messaging for urgent concerns.    DIET:  We do recommend a small meal at first, but  then you may proceed to your regular diet.  Drink plenty of fluids but you should avoid alcoholic beverages for 24 hours.  ACTIVITY:  You should plan to take it easy for the rest of today and you should NOT DRIVE or use heavy machinery until tomorrow (because of the sedation medicines used during the test).    FOLLOW UP: Our staff will call the number listed on your records 48-72 hours following your procedure to check on you and address any questions or concerns that you may have regarding the information given to you following your procedure. If we do not reach you, we will leave a message.  We will attempt to reach you two times.  During this call, we will ask if you have developed any symptoms of COVID 19. If you develop any symptoms (ie: fever, flu-like symptoms, shortness of breath, cough etc.) before then, please call [phone].  If you test positive for Covid 19 in the 2 weeks post procedure, please call and report this information to us.    If any biopsies were taken you will be contacted by phone or by letter within the next 1-3 weeks.  Please call us at [phone] if you have not heard about the biopsies in 3 weeks.    SIGNATURES/CONFIDENTIALITY: You and/or your care partner have signed paperwork which will be entered into your electronic medical record.  These signatures attest to the fact that that the information above on   your After Visit Summary has been reviewed and is understood.  Full responsibility of the confidentiality of this discharge information lies with you and/or your care-partner. 

## 2021-02-02 NOTE — Progress Notes (Signed)
Pt Drowsy. VSS. To PACU, report to RN. No anesthetic complications noted.  

## 2021-02-02 NOTE — Progress Notes (Signed)
Called to room to assist during endoscopic procedure.  Patient ID and intended procedure confirmed with present staff. Received instructions for my participation in the procedure from the performing physician.  

## 2021-02-02 NOTE — Op Note (Signed)
[facility] Patient Name: Carolyn Moss Procedure Date: 02/02/2021 7:55 AM MRN: [id number] Endoscopist: Mauri Pole , MD Age: 65 Referring MD:  Date of Birth: 09-03-1956 Gender: Female Account #: [id number] Procedure:                Colonoscopy Indications:              Screening for colorectal malignant neoplasm Medicines:                Monitored Anesthesia Care Procedure:                Pre-Anesthesia Assessment:                           - Prior to the procedure, a History and Physical                            was performed, and patient medications and                            allergies were reviewed. The patient's tolerance of                            previous anesthesia was also reviewed. The risks                            and benefits of the procedure and the sedation                            options and risks were discussed with the patient.                            All questions were answered, and informed consent                            was obtained. Prior Anticoagulants: The patient has                            taken no previous anticoagulant or antiplatelet                            agents. ASA Grade Assessment: II - A patient with                            mild systemic disease. After reviewing the risks                            and benefits, the patient was deemed in                            satisfactory condition to undergo the procedure.                           After obtaining informed consent, the colonoscope  was passed under direct vision. Throughout the                            procedure, the patient's blood pressure, pulse, and                            oxygen saturations were monitored continuously. The                            Olympus [id number]-[id number] ([id number]) Colonoscope was                            introduced through the anus and advanced to the the                            cecum,  identified by appendiceal orifice and                            ileocecal valve. The colonoscopy was performed                            without difficulty. The patient tolerated the                            procedure well. The quality of the bowel                            preparation was excellent. The ileocecal valve,                            appendiceal orifice, and rectum were photographed. Scope In: 8:09:35 AM Scope Out: 8:24:24 AM Scope Withdrawal Time: 0 hours 10 minutes 54 seconds  Total Procedure Duration: 0 hours 14 minutes 49 seconds  Findings:                 The perianal and digital rectal examinations were                            normal.                           A 11 mm polyp was found in the transverse colon.                            The polyp was flat. The polyp was removed with a                            cold snare. Resection and retrieval were complete.                           Scattered small and large-mouthed diverticula were                            found in the sigmoid colon, descending colon and  transverse colon.                           Non-bleeding external and internal hemorrhoids were                            found during retroflexion. The hemorrhoids were                            medium-sized. Complications:            No immediate complications. Estimated Blood Loss:     Estimated blood loss was minimal. Impression:               - One 11 mm polyp in the transverse colon, removed                            with a cold snare. Resected and retrieved.                           - Moderate diverticulosis in the sigmoid colon, in                            the descending colon and in the transverse colon.                           - Non-bleeding external and internal hemorrhoids. Recommendation:           - Patient has a contact number available for                            emergencies. The signs and symptoms of  potential                            delayed complications were discussed with the                            patient. Return to normal activities tomorrow.                            Written discharge instructions were provided to the                            patient.                           - Resume previous diet.                           - Continue present medications.                           - Await pathology results.                           - Repeat colonoscopy in 3 - 5 years for  surveillance based on pathology results. Mauri Pole, MD 02/02/2021 8:40:51 AM This report has been signed electronically.

## 2021-02-04 ENCOUNTER — Telehealth: Payer: Self-pay

## 2021-02-04 NOTE — Telephone Encounter (Signed)
  Follow up Call-  Call back number 02/02/2021  Post procedure Call Back phone  # [phone] cell  Permission to leave phone message Yes  Some recent data might be hidden     Patient questions:  Do you have a fever, pain , or abdominal swelling? No. Pain Score  0 *  Have you tolerated food without any problems? Yes.    Have you been able to return to your normal activities? Yes.    Do you have any questions about your discharge instructions: Diet   No. Medications  No. Follow up visit  No.  Do you have questions or concerns about your Care? No.  Actions: * If pain score is 4 or above: No action needed, pain <4.

## 2021-02-14 ENCOUNTER — Encounter: Payer: Self-pay | Admitting: Gastroenterology

## 2021-02-14 DIAGNOSIS — Z96641 Presence of right artificial hip joint: Secondary | ICD-10-CM | POA: Diagnosis not present

## 2021-04-04 DIAGNOSIS — S8265XA Nondisplaced fracture of lateral malleolus of left fibula, initial encounter for closed fracture: Secondary | ICD-10-CM | POA: Diagnosis not present

## 2021-04-04 DIAGNOSIS — S8255XA Nondisplaced fracture of medial malleolus of left tibia, initial encounter for closed fracture: Secondary | ICD-10-CM | POA: Diagnosis not present

## 2021-04-15 DIAGNOSIS — S8265XA Nondisplaced fracture of lateral malleolus of left fibula, initial encounter for closed fracture: Secondary | ICD-10-CM | POA: Diagnosis not present

## 2021-04-15 DIAGNOSIS — M25572 Pain in left ankle and joints of left foot: Secondary | ICD-10-CM | POA: Diagnosis not present

## 2021-04-15 DIAGNOSIS — M79672 Pain in left foot: Secondary | ICD-10-CM | POA: Diagnosis not present

## 2021-05-27 DIAGNOSIS — S8265XD Nondisplaced fracture of lateral malleolus of left fibula, subsequent encounter for closed fracture with routine healing: Secondary | ICD-10-CM | POA: Diagnosis not present

## 2021-05-27 DIAGNOSIS — M79672 Pain in left foot: Secondary | ICD-10-CM | POA: Diagnosis not present

## 2021-05-27 DIAGNOSIS — S8265XA Nondisplaced fracture of lateral malleolus of left fibula, initial encounter for closed fracture: Secondary | ICD-10-CM | POA: Diagnosis not present

## 2021-09-14 DIAGNOSIS — H26492 Other secondary cataract, left eye: Secondary | ICD-10-CM | POA: Diagnosis not present

## 2021-09-14 DIAGNOSIS — H2511 Age-related nuclear cataract, right eye: Secondary | ICD-10-CM | POA: Diagnosis not present

## 2021-09-14 DIAGNOSIS — H5201 Hypermetropia, right eye: Secondary | ICD-10-CM | POA: Diagnosis not present

## 2021-09-14 DIAGNOSIS — H43813 Vitreous degeneration, bilateral: Secondary | ICD-10-CM | POA: Diagnosis not present

## 2021-09-26 DIAGNOSIS — Z1152 Encounter for screening for COVID-19: Secondary | ICD-10-CM | POA: Diagnosis not present

## 2021-09-26 DIAGNOSIS — J029 Acute pharyngitis, unspecified: Secondary | ICD-10-CM | POA: Diagnosis not present

## 2021-09-26 DIAGNOSIS — J40 Bronchitis, not specified as acute or chronic: Secondary | ICD-10-CM | POA: Diagnosis not present

## 2021-09-26 DIAGNOSIS — R0981 Nasal congestion: Secondary | ICD-10-CM | POA: Diagnosis not present

## 2021-09-26 DIAGNOSIS — R051 Acute cough: Secondary | ICD-10-CM | POA: Diagnosis not present

## 2021-11-28 ENCOUNTER — Other Ambulatory Visit: Payer: Self-pay | Admitting: Internal Medicine

## 2021-11-28 DIAGNOSIS — Z1231 Encounter for screening mammogram for malignant neoplasm of breast: Secondary | ICD-10-CM

## 2021-11-29 ENCOUNTER — Other Ambulatory Visit: Payer: Self-pay

## 2021-11-29 ENCOUNTER — Ambulatory Visit
Admission: RE | Admit: 2021-11-29 | Discharge: 2021-11-29 | Disposition: A | Discharge status: Home / Self Care | Payer: PPO | Source: Ambulatory Visit | Attending: Internal Medicine | Admitting: Internal Medicine

## 2021-11-29 DIAGNOSIS — Z1231 Encounter for screening mammogram for malignant neoplasm of breast: Secondary | ICD-10-CM | POA: Diagnosis not present

## 2021-12-08 DIAGNOSIS — H2511 Age-related nuclear cataract, right eye: Secondary | ICD-10-CM | POA: Diagnosis not present

## 2021-12-08 DIAGNOSIS — H25811 Combined forms of age-related cataract, right eye: Secondary | ICD-10-CM | POA: Diagnosis not present

## 2021-12-13 DIAGNOSIS — E559 Vitamin D deficiency, unspecified: Secondary | ICD-10-CM | POA: Diagnosis not present

## 2021-12-13 DIAGNOSIS — E785 Hyperlipidemia, unspecified: Secondary | ICD-10-CM | POA: Diagnosis not present

## 2021-12-20 DIAGNOSIS — Z1331 Encounter for screening for depression: Secondary | ICD-10-CM | POA: Diagnosis not present

## 2021-12-20 DIAGNOSIS — Z0000 Encounter for general adult medical examination without abnormal findings: Secondary | ICD-10-CM | POA: Diagnosis not present

## 2021-12-20 DIAGNOSIS — E785 Hyperlipidemia, unspecified: Secondary | ICD-10-CM | POA: Diagnosis not present

## 2021-12-20 DIAGNOSIS — M1611 Unilateral primary osteoarthritis, right hip: Secondary | ICD-10-CM | POA: Diagnosis not present

## 2021-12-20 DIAGNOSIS — R82998 Other abnormal findings in urine: Secondary | ICD-10-CM | POA: Diagnosis not present

## 2021-12-20 DIAGNOSIS — Z23 Encounter for immunization: Secondary | ICD-10-CM | POA: Diagnosis not present

## 2021-12-20 DIAGNOSIS — E559 Vitamin D deficiency, unspecified: Secondary | ICD-10-CM | POA: Diagnosis not present

## 2021-12-20 DIAGNOSIS — Z1339 Encounter for screening examination for other mental health and behavioral disorders: Secondary | ICD-10-CM | POA: Diagnosis not present

## 2022-03-08 DIAGNOSIS — D2271 Melanocytic nevi of right lower limb, including hip: Secondary | ICD-10-CM | POA: Diagnosis not present

## 2022-03-08 DIAGNOSIS — D2261 Melanocytic nevi of right upper limb, including shoulder: Secondary | ICD-10-CM | POA: Diagnosis not present

## 2022-03-08 DIAGNOSIS — D2272 Melanocytic nevi of left lower limb, including hip: Secondary | ICD-10-CM | POA: Diagnosis not present

## 2022-03-08 DIAGNOSIS — L821 Other seborrheic keratosis: Secondary | ICD-10-CM | POA: Diagnosis not present

## 2022-03-08 DIAGNOSIS — D1801 Hemangioma of skin and subcutaneous tissue: Secondary | ICD-10-CM | POA: Diagnosis not present

## 2022-03-08 DIAGNOSIS — L814 Other melanin hyperpigmentation: Secondary | ICD-10-CM | POA: Diagnosis not present

## 2022-03-08 DIAGNOSIS — L570 Actinic keratosis: Secondary | ICD-10-CM | POA: Diagnosis not present

## 2022-03-08 DIAGNOSIS — D485 Neoplasm of uncertain behavior of skin: Secondary | ICD-10-CM | POA: Diagnosis not present

## 2022-03-08 DIAGNOSIS — D2262 Melanocytic nevi of left upper limb, including shoulder: Secondary | ICD-10-CM | POA: Diagnosis not present

## 2022-03-08 DIAGNOSIS — D225 Melanocytic nevi of trunk: Secondary | ICD-10-CM | POA: Diagnosis not present

## 2022-08-01 DIAGNOSIS — U071 COVID-19: Secondary | ICD-10-CM | POA: Diagnosis not present

## 2022-08-01 DIAGNOSIS — R509 Fever, unspecified: Secondary | ICD-10-CM | POA: Diagnosis not present

## 2022-08-01 DIAGNOSIS — E785 Hyperlipidemia, unspecified: Secondary | ICD-10-CM | POA: Diagnosis not present

## 2022-08-01 DIAGNOSIS — R638 Other symptoms and signs concerning food and fluid intake: Secondary | ICD-10-CM | POA: Diagnosis not present

## 2022-08-01 DIAGNOSIS — R0981 Nasal congestion: Secondary | ICD-10-CM | POA: Diagnosis not present

## 2022-08-01 DIAGNOSIS — J029 Acute pharyngitis, unspecified: Secondary | ICD-10-CM | POA: Diagnosis not present

## 2022-08-01 DIAGNOSIS — Z1152 Encounter for screening for COVID-19: Secondary | ICD-10-CM | POA: Diagnosis not present

## 2022-12-25 ENCOUNTER — Other Ambulatory Visit: Payer: Self-pay | Admitting: Internal Medicine

## 2022-12-25 DIAGNOSIS — Z1231 Encounter for screening mammogram for malignant neoplasm of breast: Secondary | ICD-10-CM

## 2022-12-25 DIAGNOSIS — R7989 Other specified abnormal findings of blood chemistry: Secondary | ICD-10-CM | POA: Diagnosis not present

## 2022-12-25 DIAGNOSIS — E559 Vitamin D deficiency, unspecified: Secondary | ICD-10-CM | POA: Diagnosis not present

## 2022-12-25 DIAGNOSIS — E785 Hyperlipidemia, unspecified: Secondary | ICD-10-CM | POA: Diagnosis not present

## 2023-01-09 DIAGNOSIS — M1611 Unilateral primary osteoarthritis, right hip: Secondary | ICD-10-CM | POA: Diagnosis not present

## 2023-01-09 DIAGNOSIS — Z1331 Encounter for screening for depression: Secondary | ICD-10-CM | POA: Diagnosis not present

## 2023-01-09 DIAGNOSIS — Z0000 Encounter for general adult medical examination without abnormal findings: Secondary | ICD-10-CM | POA: Diagnosis not present

## 2023-01-09 DIAGNOSIS — E559 Vitamin D deficiency, unspecified: Secondary | ICD-10-CM | POA: Diagnosis not present

## 2023-01-09 DIAGNOSIS — E785 Hyperlipidemia, unspecified: Secondary | ICD-10-CM | POA: Diagnosis not present

## 2023-01-09 DIAGNOSIS — Z1339 Encounter for screening examination for other mental health and behavioral disorders: Secondary | ICD-10-CM | POA: Diagnosis not present

## 2023-01-19 ENCOUNTER — Ambulatory Visit
Admission: RE | Admit: 2023-01-19 | Discharge: 2023-01-19 | Disposition: A | Discharge status: Home / Self Care | Payer: PPO | Source: Ambulatory Visit | Attending: Internal Medicine | Admitting: Internal Medicine

## 2023-01-19 DIAGNOSIS — Z1231 Encounter for screening mammogram for malignant neoplasm of breast: Secondary | ICD-10-CM | POA: Diagnosis not present

## 2023-01-31 DIAGNOSIS — H524 Presbyopia: Secondary | ICD-10-CM | POA: Diagnosis not present

## 2023-01-31 DIAGNOSIS — H04123 Dry eye syndrome of bilateral lacrimal glands: Secondary | ICD-10-CM | POA: Diagnosis not present

## 2023-01-31 DIAGNOSIS — H26493 Other secondary cataract, bilateral: Secondary | ICD-10-CM | POA: Diagnosis not present

## 2023-01-31 DIAGNOSIS — H52203 Unspecified astigmatism, bilateral: Secondary | ICD-10-CM | POA: Diagnosis not present

## 2023-04-04 DIAGNOSIS — L812 Freckles: Secondary | ICD-10-CM | POA: Diagnosis not present

## 2023-04-04 DIAGNOSIS — D225 Melanocytic nevi of trunk: Secondary | ICD-10-CM | POA: Diagnosis not present

## 2023-04-04 DIAGNOSIS — L821 Other seborrheic keratosis: Secondary | ICD-10-CM | POA: Diagnosis not present

## 2023-04-04 DIAGNOSIS — D1801 Hemangioma of skin and subcutaneous tissue: Secondary | ICD-10-CM | POA: Diagnosis not present

## 2023-04-04 DIAGNOSIS — L570 Actinic keratosis: Secondary | ICD-10-CM | POA: Diagnosis not present

## 2023-05-18 DIAGNOSIS — S93492A Sprain of other ligament of left ankle, initial encounter: Secondary | ICD-10-CM | POA: Diagnosis not present

## 2023-05-28 DIAGNOSIS — S93402A Sprain of unspecified ligament of left ankle, initial encounter: Secondary | ICD-10-CM | POA: Diagnosis not present

## 2023-06-18 DIAGNOSIS — S93402A Sprain of unspecified ligament of left ankle, initial encounter: Secondary | ICD-10-CM | POA: Diagnosis not present

## 2023-07-09 DIAGNOSIS — M25572 Pain in left ankle and joints of left foot: Secondary | ICD-10-CM | POA: Diagnosis not present

## 2023-12-17 DIAGNOSIS — M25559 Pain in unspecified hip: Secondary | ICD-10-CM | POA: Diagnosis not present

## 2023-12-18 ENCOUNTER — Other Ambulatory Visit: Payer: Self-pay | Admitting: Internal Medicine

## 2023-12-18 DIAGNOSIS — Z1231 Encounter for screening mammogram for malignant neoplasm of breast: Secondary | ICD-10-CM

## 2023-12-19 DIAGNOSIS — M25559 Pain in unspecified hip: Secondary | ICD-10-CM | POA: Diagnosis not present

## 2024-01-08 DIAGNOSIS — E559 Vitamin D deficiency, unspecified: Secondary | ICD-10-CM | POA: Diagnosis not present

## 2024-01-08 DIAGNOSIS — E785 Hyperlipidemia, unspecified: Secondary | ICD-10-CM | POA: Diagnosis not present

## 2024-01-14 DIAGNOSIS — E559 Vitamin D deficiency, unspecified: Secondary | ICD-10-CM | POA: Diagnosis not present

## 2024-01-14 DIAGNOSIS — Z1331 Encounter for screening for depression: Secondary | ICD-10-CM | POA: Diagnosis not present

## 2024-01-14 DIAGNOSIS — M1611 Unilateral primary osteoarthritis, right hip: Secondary | ICD-10-CM | POA: Diagnosis not present

## 2024-01-14 DIAGNOSIS — Z1339 Encounter for screening examination for other mental health and behavioral disorders: Secondary | ICD-10-CM | POA: Diagnosis not present

## 2024-01-14 DIAGNOSIS — E785 Hyperlipidemia, unspecified: Secondary | ICD-10-CM | POA: Diagnosis not present

## 2024-01-14 DIAGNOSIS — Z0000 Encounter for general adult medical examination without abnormal findings: Secondary | ICD-10-CM | POA: Diagnosis not present

## 2024-01-22 ENCOUNTER — Ambulatory Visit
Admission: RE | Admit: 2024-01-22 | Discharge: 2024-01-22 | Disposition: A | Discharge status: Home / Self Care | Payer: PPO | Source: Ambulatory Visit | Attending: Internal Medicine | Admitting: Internal Medicine

## 2024-01-22 DIAGNOSIS — Z1231 Encounter for screening mammogram for malignant neoplasm of breast: Secondary | ICD-10-CM

## 2024-01-28 ENCOUNTER — Encounter: Payer: Self-pay | Admitting: Gastroenterology

## 2024-02-05 DIAGNOSIS — H524 Presbyopia: Secondary | ICD-10-CM | POA: Diagnosis not present

## 2024-02-05 DIAGNOSIS — H52203 Unspecified astigmatism, bilateral: Secondary | ICD-10-CM | POA: Diagnosis not present

## 2024-02-05 DIAGNOSIS — H26493 Other secondary cataract, bilateral: Secondary | ICD-10-CM | POA: Diagnosis not present

## 2024-02-05 DIAGNOSIS — H04123 Dry eye syndrome of bilateral lacrimal glands: Secondary | ICD-10-CM | POA: Diagnosis not present

## 2024-02-15 ENCOUNTER — Ambulatory Visit (AMBULATORY_SURGERY_CENTER)

## 2024-02-15 VITALS — Ht 62.0 in | Wt 165.0 lb

## 2024-02-15 DIAGNOSIS — Z860100 Personal history of colon polyps, unspecified: Secondary | ICD-10-CM

## 2024-02-15 MED ORDER — SUFLAVE 178.7 G PO SOLR: 1.0000 | Freq: Once | ORAL | 0 refills | Status: AC

## 2024-02-15 NOTE — Progress Notes (Signed)

## 2024-03-11 ENCOUNTER — Encounter: Payer: Self-pay | Admitting: Gastroenterology

## 2024-03-14 ENCOUNTER — Ambulatory Visit: Admitting: Gastroenterology

## 2024-03-14 ENCOUNTER — Encounter: Payer: Self-pay | Admitting: Gastroenterology

## 2024-03-14 VITALS — BP 116/74 | HR 66 | Temp 97.2°F | Resp 10 | Ht 62.0 in | Wt 165.0 lb

## 2024-03-14 DIAGNOSIS — Z860101 Personal history of adenomatous and serrated colon polyps: Secondary | ICD-10-CM

## 2024-03-14 DIAGNOSIS — Z860100 Personal history of colon polyps, unspecified: Secondary | ICD-10-CM

## 2024-03-14 DIAGNOSIS — D128 Benign neoplasm of rectum: Secondary | ICD-10-CM | POA: Diagnosis not present

## 2024-03-14 DIAGNOSIS — K319 Disease of stomach and duodenum, unspecified: Secondary | ICD-10-CM | POA: Diagnosis not present

## 2024-03-14 DIAGNOSIS — Z1211 Encounter for screening for malignant neoplasm of colon: Secondary | ICD-10-CM

## 2024-03-14 DIAGNOSIS — K5730 Diverticulosis of large intestine without perforation or abscess without bleeding: Secondary | ICD-10-CM

## 2024-03-14 DIAGNOSIS — K644 Residual hemorrhoidal skin tags: Secondary | ICD-10-CM

## 2024-03-14 DIAGNOSIS — K648 Other hemorrhoids: Secondary | ICD-10-CM | POA: Diagnosis not present

## 2024-03-14 MED ORDER — SODIUM CHLORIDE 0.9 % IV SOLN: 500.0000 mL | Freq: Once | INTRAVENOUS | Status: DC

## 2024-03-14 NOTE — Op Note (Signed)
 [facility] Patient Name: Carolyn Moss Procedure Date: 03/14/2024 8:35 AM MRN: [id number] Endoscopist: Sergio Dandy , MD, [id number] Age: 68 Referring MD:  Date of Birth: May 23, 1956 Gender: Female Account #: [id number] Procedure:                Colonoscopy Indications:              High risk colon cancer surveillance: Personal                            history of adenoma (10 mm or greater in size) Medicines:                Monitored Anesthesia Care Procedure:                Pre-Anesthesia Assessment:                           - Prior to the procedure, a History and Physical                            was performed, and patient medications and                            allergies were reviewed. The patient's tolerance of                            previous anesthesia was also reviewed. The risks                            and benefits of the procedure and the sedation                            options and risks were discussed with the patient.                            All questions were answered, and informed consent                            was obtained. Prior Anticoagulants: The patient has                            taken no anticoagulant or antiplatelet agents. ASA                            Grade Assessment: II - A patient with mild systemic                            disease. After reviewing the risks and benefits,                            the patient was deemed in satisfactory condition to                            undergo the procedure.  After obtaining informed consent, the colonoscope                            was passed under direct vision. Throughout the                            procedure, the patient's blood pressure, pulse, and                            oxygen saturations were monitored continuously. The                            Olympus Scope SN [id number] was introduced through the                            anus and  advanced to the the cecum, identified by                            appendiceal orifice and ileocecal valve. The                            colonoscopy was performed without difficulty. The                            patient tolerated the procedure well. The quality                            of the bowel preparation was good. The ileocecal                            valve, appendiceal orifice, and rectum were                            photographed. Scope In: 8:43:56 AM Scope Out: 8:56:37 AM Scope Withdrawal Time: 0 hours 9 minutes 20 seconds  Total Procedure Duration: 0 hours 12 minutes 41 seconds  Findings:                 The perianal and digital rectal examinations were                            normal.                           A 7 mm polyp was found in the rectum. The polyp was                            sessile. The polyp was removed with a cold snare.                            Resection and retrieval were complete.                           Scattered large-mouthed, medium-mouthed and  small-mouthed diverticula were found in the sigmoid                            colon, descending colon, transverse colon and                            ascending colon.                           Non-bleeding external and internal hemorrhoids were                            found during retroflexion. The hemorrhoids were                            medium-sized. Complications:            No immediate complications. Estimated Blood Loss:     Estimated blood loss was minimal. Impression:               - One 7 mm polyp in the rectum, removed with a cold                            snare. Resected and retrieved.                           - Diverticulosis in the sigmoid colon, in the                            descending colon, in the transverse colon and in                            the ascending colon.                           - Non-bleeding external and internal  hemorrhoids. Recommendation:           - Patient has a contact number available for                            emergencies. The signs and symptoms of potential                            delayed complications were discussed with the                            patient. Return to normal activities tomorrow.                            Written discharge instructions were provided to the                            patient.                           - Resume previous diet.                           -  Continue present medications.                           - Await pathology results.                           - Repeat colonoscopy in 5 years for surveillance                            based on pathology results. [name] V. Romonda Parker, MD 03/14/2024 9:09:14 AM This report has been signed electronically.

## 2024-03-14 NOTE — Progress Notes (Signed)
 [facility] History and Physical   Primary Care Physician:  Barnetta Liberty, MD   Reason for Procedure:  History of adenomatous colon polyps  Plan:    Surveillance colonoscopy with possible interventions as needed     HPI: Carolyn Moss is a very pleasant 68 y.o. female here for surveillance colonoscopy. Denies any nausea, vomiting, abdominal pain, melena or bright red blood per rectum  The risks and benefits as well as alternatives of endoscopic procedure(s) have been discussed and reviewed. All questions answered. The patient agrees to proceed.    Past Medical History:  Diagnosis Date   Cataract    lt eye cataract repaired.   Endometriosis    Endometrioma-Right ovary   Hyperlipidemia    Osteopenia 12/2011   t score -1.1 Frax 13%/0.4   Osteoporosis    osteopenia    Past Surgical History:  Procedure Laterality Date   BREAST EXCISIONAL BIOPSY Right 04/26/1998   benign   BREAST SURGERY     Biopsy-benign   cateract surgery     COLONOSCOPY  2011   JOINT REPLACEMENT     OOPHORECTOMY     RSO   PELVIC LAPAROSCOPY     DL RSO   TUBAL LIGATION      Prior to Admission medications   Medication Sig Start Date End Date Taking? Authorizing Provider  amoxicillin (AMOXIL) 500 MG tablet amoxicillin 500 mg tablet  TAKE 4 TABLETS BY MOUTH 1 TO 2 HOURS BEFORE DENTAL WORK   Yes [provider]  rosuvastatin (CRESTOR) 5 MG tablet Take 1 tablet by mouth daily. 01/09/23  Yes [provider]  Vitamin D , Ergocalciferol , (DRISDOL ) 50000 UNITS CAPS capsule Take 1 capsule (50,000 Units total) by mouth every 7 (seven) days. 02/19/14  Yes Davia Erps, MD    Current Outpatient Medications  Medication Sig Dispense Refill   amoxicillin (AMOXIL) 500 MG tablet amoxicillin 500 mg tablet  TAKE 4 TABLETS BY MOUTH 1 TO 2 HOURS BEFORE DENTAL WORK     rosuvastatin (CRESTOR) 5 MG tablet Take 1 tablet by mouth daily.     Vitamin D , Ergocalciferol , (DRISDOL ) 50000  UNITS CAPS capsule Take 1 capsule (50,000 Units total) by mouth every 7 (seven) days. 12 capsule 0   Current Facility-Administered Medications  Medication Dose Route Frequency Provider Last Rate Last Admin   0.9 %  sodium chloride  infusion  500 mL Intravenous Once Tonyia Marschall V, MD        Allergies as of 03/14/2024   (No Known Allergies)    Family History  Problem Relation Age of Onset   Breast cancer Mother 109   Breast cancer Sister 77   Breast cancer Maternal Grandmother 31   Colon cancer Neg Hx    Colon polyps Neg Hx    Esophageal cancer Neg Hx    Rectal cancer Neg Hx    Stomach cancer Neg Hx     Social History   Socioeconomic History   Marital status: Married    Spouse name: Not on file   Number of children: Not on file   Years of education: Not on file   Highest education level: Not on file  Occupational History   Not on file  Tobacco Use   Smoking status: Former   Smokeless tobacco: Never  Vaping Use   Vaping status: Never Used  Substance and Sexual Activity   Alcohol  use: Yes    Alcohol /week: 14.0 standard drinks of alcohol     Types: 14 Standard drinks or  equivalent per week   Drug use: Never   Sexual activity: Yes    Birth control/protection: Surgical, Post-menopausal    Comment: Tubal lig  Other Topics Concern   Not on file  Social History Narrative   Not on file   Social Drivers of Health   Financial Resource Strain: Not on file  Food Insecurity: Not on file  Transportation Needs: Not on file  Physical Activity: Not on file  Stress: Not on file  Social Connections: Not on file  Intimate Partner Violence: Not on file    Review of Systems:  All other review of systems negative except as mentioned in the HPI.  Physical Exam: Vital signs in last 24 hours: BP 129/69   Pulse 86   Temp (!) 97.2 F (36.2 C) (Temporal)   Resp 15   Ht 5\' 2"  (1.575 m)   Wt 165 lb (74.8 kg)   SpO2 98%   BMI 30.18 kg/m  General:   Alert, NAD Lungs:   Clear .   Heart:  Regular rate and rhythm Abdomen:  Soft, nontender and nondistended. Neuro/Psych:  Alert and cooperative. Normal mood and affect. A and O x 3  Reviewed labs, radiology imaging, old records and pertinent past GI work up  Patient is appropriate for planned procedure(s) and anesthesia in an ambulatory setting   K. Veena Verdene Creson , MD [phone]

## 2024-03-14 NOTE — Progress Notes (Signed)
 Called to room to assist during endoscopic procedure.  Patient ID and intended procedure confirmed with present staff. Received instructions for my participation in the procedure from the performing physician.

## 2024-03-14 NOTE — Patient Instructions (Addendum)
-  Handout on polyps, hemorrhoids and diverticulosis provided -await pathology results -repeat colonoscopy for surveillance in 5 years recommended -Continue present medications    YOU HAD AN ENDOSCOPIC PROCEDURE TODAY AT THE [facility]:   Refer to the procedure report that was given to you for any specific questions about what was found during the examination.  If the procedure report does not answer your questions, please call your gastroenterologist to clarify.  If you requested that your care partner not be given the details of your procedure findings, then the procedure report has been included in a sealed envelope for you to review at your convenience later.  YOU SHOULD EXPECT: Some feelings of bloating in the abdomen. Passage of more gas than usual.  Walking can help get rid of the air that was put into your GI tract during the procedure and reduce the bloating. If you had a lower endoscopy (such as a colonoscopy or flexible sigmoidoscopy) you may notice spotting of blood in your stool or on the toilet paper. If you underwent a bowel prep for your procedure, you may not have a normal bowel movement for a few days.  Please Note:  You might notice some irritation and congestion in your nose or some drainage.  This is from the oxygen used during your procedure.  There is no need for concern and it should clear up in a day or so.  SYMPTOMS TO REPORT IMMEDIATELY:  Following lower endoscopy (colonoscopy or flexible sigmoidoscopy):  Excessive amounts of blood in the stool  Significant tenderness or worsening of abdominal pains  Swelling of the abdomen that is new, acute  Fever of 100F or higher  For urgent or emergent issues, a gastroenterologist can be reached at any hour by calling [phone]. Do not use MyChart messaging for urgent concerns.    DIET:  We do recommend a small meal at first, but then you may proceed to your regular diet.  Drink plenty of fluids but you should  avoid alcoholic beverages for 24 hours.  ACTIVITY:  You should plan to take it easy for the rest of today and you should NOT DRIVE or use heavy machinery until tomorrow (because of the sedation medicines used during the test).    FOLLOW UP: Our staff will call the number listed on your records the next business day following your procedure.  We will call around 7:15- 8:00 am to check on you and address any questions or concerns that you may have regarding the information given to you following your procedure. If we do not reach you, we will leave a message.     If any biopsies were taken you will be contacted by phone or by letter within the next 1-3 weeks.  Please call us  at [phone] if you have not heard about the biopsies in 3 weeks.    SIGNATURES/CONFIDENTIALITY: You and/or your care partner have signed paperwork which will be entered into your electronic medical record.  These signatures attest to the fact that that the information above on your After Visit Summary has been reviewed and is understood.  Full responsibility of the confidentiality of this discharge information lies with you and/or your care-partner.

## 2024-03-14 NOTE — Progress Notes (Signed)
 Report to PACU, RN, vss, BBS= Clear.

## 2024-03-14 NOTE — Progress Notes (Signed)
 Pt's states no medical or surgical changes since previsit or office visit.

## 2024-03-17 ENCOUNTER — Telehealth: Payer: Self-pay

## 2024-03-17 NOTE — Telephone Encounter (Signed)
  Follow up Call-     03/14/2024    8:00 AM  Call back number  Post procedure Call Back phone  # [phone]  Permission to leave phone message Yes     Patient questions:  Do you have a fever, pain , or abdominal swelling? No. Pain Score  0 *  Have you tolerated food without any problems? Yes.    Have you been able to return to your normal activities? Yes.    Do you have any questions about your discharge instructions: Diet   No. Medications  No. Follow up visit  No.  Do you have questions or concerns about your Care? No.  Actions: * If pain score is 4 or above: No action needed, pain <4.

## 2024-03-18 LAB — SURGICAL PATHOLOGY

## 2024-04-15 ENCOUNTER — Ambulatory Visit: Payer: Self-pay | Admitting: Gastroenterology

## 2024-04-28 DIAGNOSIS — D1801 Hemangioma of skin and subcutaneous tissue: Secondary | ICD-10-CM | POA: Diagnosis not present

## 2024-04-28 DIAGNOSIS — L820 Inflamed seborrheic keratosis: Secondary | ICD-10-CM | POA: Diagnosis not present

## 2024-04-28 DIAGNOSIS — L812 Freckles: Secondary | ICD-10-CM | POA: Diagnosis not present

## 2024-04-28 DIAGNOSIS — D225 Melanocytic nevi of trunk: Secondary | ICD-10-CM | POA: Diagnosis not present

## 2024-04-28 DIAGNOSIS — B078 Other viral warts: Secondary | ICD-10-CM | POA: Diagnosis not present
